# Patient Record
Sex: Female | Born: 1970 | Race: White | Hispanic: No | Marital: Married | Smoking: Never smoker
Health system: Southern US, Community
[De-identification: ages and names within clinical notes are randomized; demographics above are authoritative.]

## PROBLEM LIST (undated history)

## (undated) DIAGNOSIS — T7840XA Allergy, unspecified, initial encounter: Secondary | ICD-10-CM

## (undated) DIAGNOSIS — Z9889 Other specified postprocedural states: Secondary | ICD-10-CM

## (undated) HISTORY — PX: LEEP: SHX91

## (undated) HISTORY — PX: OTHER SURGICAL HISTORY: SHX169

## (undated) HISTORY — PX: KNEE SURGERY: SHX244

## (undated) HISTORY — DX: Allergy, unspecified, initial encounter: T78.40XA

## (undated) HISTORY — PX: MENISCUS REPAIR: SHX5179

## (undated) HISTORY — PX: EXTERNAL EAR SURGERY: SHX627

---

## 2003-05-25 ENCOUNTER — Other Ambulatory Visit: Admission: RE | Admit: 2003-05-25 | Discharge: 2003-05-25 | Payer: Self-pay | Admitting: Obstetrics and Gynecology

## 2004-07-25 ENCOUNTER — Inpatient Hospital Stay: Payer: Self-pay

## 2004-09-13 ENCOUNTER — Ambulatory Visit: Payer: Self-pay | Admitting: Family Medicine

## 2004-11-29 ENCOUNTER — Ambulatory Visit (HOSPITAL_BASED_OUTPATIENT_CLINIC_OR_DEPARTMENT_OTHER): Admission: RE | Admit: 2004-11-29 | Discharge: 2004-11-29 | Payer: Self-pay | Admitting: Orthopedic Surgery

## 2004-12-09 ENCOUNTER — Encounter: Payer: Self-pay | Admitting: Family Medicine

## 2005-02-02 ENCOUNTER — Ambulatory Visit: Payer: Self-pay | Admitting: Internal Medicine

## 2005-07-30 ENCOUNTER — Ambulatory Visit: Payer: Self-pay | Admitting: Family Medicine

## 2005-08-06 ENCOUNTER — Ambulatory Visit: Payer: Self-pay | Admitting: Family Medicine

## 2007-05-16 ENCOUNTER — Ambulatory Visit: Payer: Self-pay | Admitting: Family Medicine

## 2007-05-17 LAB — CONVERTED CEMR LAB
AST: 17 units/L (ref 0–37)
Albumin: 4.2 g/dL (ref 3.5–5.2)
CO2: 28 meq/L (ref 19–32)
Chloride: 104 meq/L (ref 96–112)
Cholesterol: 175 mg/dL (ref 0–200)
Creatinine, Ser: 0.8 mg/dL (ref 0.4–1.2)
HDL: 50.9 mg/dL (ref >39.0)
Sodium: 137 meq/L (ref 135–145)
TSH: 1.77 microintl units/mL (ref 0.35–5.50)
Total Bilirubin: 2.3 mg/dL — ABNORMAL HIGH (ref 0.3–1.2)
Total Protein: 6.8 g/dL (ref 6.0–8.3)
Triglycerides: 76 mg/dL (ref 0–149)
VLDL: 15 mg/dL (ref 0–40)

## 2007-05-20 ENCOUNTER — Encounter: Payer: Self-pay | Admitting: Family Medicine

## 2007-05-20 DIAGNOSIS — E7800 Pure hypercholesterolemia, unspecified: Secondary | ICD-10-CM | POA: Insufficient documentation

## 2007-05-20 DIAGNOSIS — J309 Allergic rhinitis, unspecified: Secondary | ICD-10-CM | POA: Insufficient documentation

## 2007-05-21 ENCOUNTER — Ambulatory Visit: Payer: Self-pay | Admitting: Family Medicine

## 2007-05-21 DIAGNOSIS — E039 Hypothyroidism, unspecified: Secondary | ICD-10-CM | POA: Insufficient documentation

## 2007-05-21 DIAGNOSIS — E038 Other specified hypothyroidism: Secondary | ICD-10-CM | POA: Insufficient documentation

## 2007-07-04 ENCOUNTER — Ambulatory Visit: Payer: Self-pay

## 2007-08-22 ENCOUNTER — Telehealth: Payer: Self-pay | Admitting: Family Medicine

## 2007-11-21 ENCOUNTER — Ambulatory Visit: Payer: Self-pay | Admitting: Family Medicine

## 2007-11-23 LAB — CONVERTED CEMR LAB
Cholesterol: 168 mg/dL (ref 0–200)
GFR calc Af Amer: 122 mL/min
GFR calc non Af Amer: 101 mL/min
Glucose, Bld: 91 mg/dL (ref 70–99)
HDL: 62.4 mg/dL (ref >39.0)
LDL Cholesterol: 99 mg/dL (ref 0–99)
Sodium: 140 meq/L (ref 135–145)
Total CHOL/HDL Ratio: 2.7
VLDL: 7 mg/dL (ref 0–40)

## 2007-11-28 ENCOUNTER — Ambulatory Visit: Payer: Self-pay | Admitting: Family Medicine

## 2008-06-02 ENCOUNTER — Ambulatory Visit: Payer: Self-pay | Admitting: Family Medicine

## 2008-06-03 LAB — CONVERTED CEMR LAB
AST: 17 units/L (ref 0–37)
Alkaline Phosphatase: 55 units/L (ref 39–117)
Bilirubin, Direct: 0.2 mg/dL (ref 0.0–0.3)
Chloride: 112 meq/L (ref 96–112)
Free T4: 0.9 ng/dL (ref 0.6–1.6)
GFR calc non Af Amer: 87 mL/min
LDL Cholesterol: 101 mg/dL — ABNORMAL HIGH (ref 0–99)
Potassium: 4.3 meq/L (ref 3.5–5.1)
Sodium: 142 meq/L (ref 135–145)
Total Bilirubin: 1 mg/dL (ref 0.3–1.2)
Total CHOL/HDL Ratio: 2.6

## 2008-06-07 ENCOUNTER — Ambulatory Visit: Payer: Self-pay | Admitting: Family Medicine

## 2009-03-16 ENCOUNTER — Ambulatory Visit: Payer: Self-pay | Admitting: Family Medicine

## 2009-03-16 DIAGNOSIS — R51 Headache: Secondary | ICD-10-CM | POA: Insufficient documentation

## 2009-03-16 DIAGNOSIS — R519 Headache, unspecified: Secondary | ICD-10-CM | POA: Insufficient documentation

## 2010-03-22 ENCOUNTER — Ambulatory Visit: Payer: Self-pay | Admitting: Family Medicine

## 2010-04-18 ENCOUNTER — Encounter (INDEPENDENT_AMBULATORY_CARE_PROVIDER_SITE_OTHER): Payer: Self-pay | Admitting: *Deleted

## 2010-10-10 NOTE — Letter (Signed)
Summary: Nadara Eaton letter  [place] at [facility]  [address] [place], [place] [address]   Phone: [phone]  Fax: [phone]       04/18/2010 MRN: [id number]  SPRUHA WEIGHT [address] [place], [place]  [address]  Dear Ms. [facility] [place], and [place] announce the retirement of Arta Silence, M.D., from full-time practice at the [facility] office effective March 09, 2010 and his plans of returning part-time.  It is important to Dr. Hetty Ely and to our practice that you understand that [facility] has seven physicians in our office for your health care needs.  We will continue to offer the same exceptional care that you have today.    Dr. Hetty Ely has spoken to many of you about his plans for retirement and returning part-time in the fall.   We will continue to work with you through the transition to schedule appointments for you in the office and meet the high standards that [facility] is committed to.   Again, it is with great pleasure that we share the news that Dr. Hetty Ely will return to [facility] in October of 2011 with a reduced schedule.    If you have any questions, or would like to request an appointment with one of our physicians, please call us at [phone] and press the option for Scheduling an appointment.  We take pleasure in providing you with excellent patient care and look forward to seeing you at your next office visit.  Our [facility] Physicians are:  Tillman Abide, M.D. Laurita Quint, M.D. Roxy Manns, M.D. Kerby Nora, M.D. Hannah Beat, M.D. Ruthe Mannan, M.D. We proudly welcomed Raechel Ache, M.D. and Eustaquio Boyden, M.D. to the practice in July/August 2011.  Sincerely,  [facility]

## 2011-01-26 NOTE — Op Note (Signed)
[name], [name]                ACCOUNT NO.:  [id number]   MEDICAL RECORD NO.:  [id number]          PATIENT TYPE:  AMB   LOCATION:  [facility]                         FACILITY:  [facility]   PHYSICIAN:  Ollen Gross, M.D.    DATE OF BIRTH:  Oct 05, 1970   DATE OF PROCEDURE:  11/29/2004  DATE OF DISCHARGE:                                 OPERATIVE REPORT   PREOPERATIVE DIAGNOSIS:  Right knee medial meniscal tear, bucket handle.   POSTOPERATIVE DIAGNOSIS:  Right knee medial meniscal tear, bucket handle.   PROCEDURE:  Right knee arthroscopy with meniscal debridement.   SURGEON:  Ollen Gross, M.D.   No assistant.   ANESTHESIA:  Local with MAC.   ESTIMATED BLOOD LOSS:  Minimal.   DRAIN:  None.   COMPLICATIONS:  None.   CONDITION:  Stable to recovery.   BRIEF CLINICAL NOTE:  Krista Mosley is a 40 year old female who approximately four to  six weeks ago developed significant pain, locking, and instability in her  right knee.  She has had a previous ACL reconstruction about 10 years ago.  MRI scan showed intact ACL graft but a bucket-handle tear of the medial  meniscus.  She presents now for arthroscopic debridement.   PROCEDURE IN DETAIL:  After successful administration of local with MAC  anesthetic, a tourniquet was placed high on left thigh and the right lower  extremity prepped and draped in the usual sterile fashion.  Standard  superomedial and inferolateral incisions were made.  Inflow cannula passed  superomedial, and camera passed inferolateral.  Arthroscopic visualization  proceeded.  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  Trochlea also looked normal.  Medial and lateral gutters were visualized and were normal.  Flexion valgus  forceps were applied to the knee, and the medial compartment was entered.  She had a large bucket-handle tear starting at the body and coursing all the  way to the posterior attachment of the  medial meniscus.  It was displaced  centrally.  The condyle and tibial plateau looked normal.  Spinal needle was  used to localize the inferomedial portal.  Small incision made.  Elevator  placed.  Then, the arthroscopic scissors were placed.  We cut the fragment  at its base on the body and the base on the posterior horn and removed the  fragment, then debrided the rest of the posterior horn and body back to a  stable remnant.  It was probed and found to be stable.  The cartilage on the  medial femoral condyle and tibial plateau looked normal.  The intercondylar  notch was visualized, and the ACL graft appeared normal and probed normally.  The lateral compartment was entered and was also normal.  The small  cartilage defect on the undersurface of the patella was debrided back to a  stable cartilaginous base with the 4.2 mm shaver.  The arthroscopic  equipment was then removed from the inferior portals  which were closed with interrupted 4-0 nylon.  20 cc of 0.25% Marcaine with  epinephrine were injected through an  inflow cannula.  Then, that cannula was  removed and that portal closed with nylon.  A bulky sterile dressing was  applied, and she was subsequently awakened and transported to recovery in  stable condition.      FA/MEDQ  D:  11/29/2004  T:  11/29/2004  Job:  [id number]

## 2012-06-24 LAB — BASIC METABOLIC PANEL
BUN: 13 mg/dL (ref 4–21)
CREATININE: 0.8 mg/dL (ref 0.5–1.1)
GLUCOSE: 102 mg/dL
POTASSIUM: 4.5 mmol/L (ref 3.4–5.3)
SODIUM: 140 mmol/L (ref 137–147)

## 2012-06-24 LAB — HEPATIC FUNCTION PANEL
ALT: 8 U/L (ref 7–35)
AST: 14 U/L (ref 13–35)
Alkaline Phosphatase: 59 U/L (ref 25–125)
Bilirubin, Total: 1.1 mg/dL

## 2012-06-24 LAB — TSH: TSH: 2.32 u[IU]/mL (ref 0.41–5.90)

## 2012-06-24 LAB — LIPID PANEL
CHOLESTEROL: 197 mg/dL (ref 0–200)
HDL: 94 mg/dL — AB (ref 35–70)
LDL CALC: 88 mg/dL
LDl/HDL Ratio: 0.9
TRIGLYCERIDES: 73 mg/dL (ref 40–160)

## 2014-02-25 LAB — CBC AND DIFFERENTIAL
HCT: 42 % (ref 36–46)
Hemoglobin: 14.2 g/dL (ref 12.0–16.0)
NEUTROS ABS: 2 /uL
Platelets: 263 10*3/uL (ref 150–399)
WBC: 4.1 10^3/mL

## 2014-02-25 LAB — HEMOGLOBIN A1C: HEMOGLOBIN A1C: 5.6

## 2015-07-23 ENCOUNTER — Ambulatory Visit (INDEPENDENT_AMBULATORY_CARE_PROVIDER_SITE_OTHER): Payer: No Typology Code available for payment source

## 2015-07-23 DIAGNOSIS — Z23 Encounter for immunization: Secondary | ICD-10-CM

## 2015-08-17 ENCOUNTER — Encounter: Payer: Self-pay | Admitting: Family Medicine

## 2016-01-04 ENCOUNTER — Telehealth: Payer: Self-pay | Admitting: Family Medicine

## 2016-01-04 NOTE — Telephone Encounter (Signed)
Pt needs for us to look through her records and see if we have a record of her having a td vaccine. Pt is going on a field trip next week and needs to know that date of vaccine.  Her call back is [phone]  Thanks, Barth Kirksteri

## 2016-01-10 NOTE — Telephone Encounter (Signed)
Pt called back to see if she could find out when she had her last Tetanus shot and when she is do for another. Please advise. Thanks TNP

## 2016-01-11 ENCOUNTER — Telehealth: Payer: Self-pay | Admitting: Family Medicine

## 2016-01-11 NOTE — Telephone Encounter (Signed)
Tdap in 06/16/2011. LMTCB to inform pt. Allene DillonEmily Drozdowski, CMA

## 2016-01-11 NOTE — Telephone Encounter (Signed)
Pt returning call, please return her call, pt states it's ok to leave message to what pt is requesting to know. CB# [phone]   Thanks CC

## 2016-01-11 NOTE — Telephone Encounter (Signed)
Left message advising pt.   Thanks,   -Laura  

## 2016-01-25 DIAGNOSIS — B001 Herpesviral vesicular dermatitis: Secondary | ICD-10-CM | POA: Insufficient documentation

## 2016-01-25 DIAGNOSIS — N83209 Unspecified ovarian cyst, unspecified side: Secondary | ICD-10-CM | POA: Insufficient documentation

## 2016-01-25 DIAGNOSIS — Z862 Personal history of diseases of the blood and blood-forming organs and certain disorders involving the immune mechanism: Secondary | ICD-10-CM | POA: Insufficient documentation

## 2016-01-25 DIAGNOSIS — R739 Hyperglycemia, unspecified: Secondary | ICD-10-CM | POA: Insufficient documentation

## 2016-01-30 DIAGNOSIS — H524 Presbyopia: Secondary | ICD-10-CM | POA: Diagnosis not present

## 2016-01-30 DIAGNOSIS — H5203 Hypermetropia, bilateral: Secondary | ICD-10-CM | POA: Diagnosis not present

## 2016-03-07 ENCOUNTER — Encounter: Payer: Self-pay | Admitting: Family Medicine

## 2016-03-07 ENCOUNTER — Ambulatory Visit (INDEPENDENT_AMBULATORY_CARE_PROVIDER_SITE_OTHER): Payer: BLUE CROSS/BLUE SHIELD | Admitting: Family Medicine

## 2016-03-07 VITALS — BP 100/68 | HR 72 | Temp 98.0°F | Resp 16 | Ht 64.0 in | Wt 146.0 lb

## 2016-03-07 DIAGNOSIS — Z0000 Encounter for general adult medical examination without abnormal findings: Secondary | ICD-10-CM

## 2016-03-07 DIAGNOSIS — E7800 Pure hypercholesterolemia, unspecified: Secondary | ICD-10-CM

## 2016-03-07 DIAGNOSIS — R739 Hyperglycemia, unspecified: Secondary | ICD-10-CM

## 2016-03-07 DIAGNOSIS — E038 Other specified hypothyroidism: Secondary | ICD-10-CM

## 2016-03-07 DIAGNOSIS — E039 Hypothyroidism, unspecified: Secondary | ICD-10-CM

## 2016-03-07 DIAGNOSIS — D508 Other iron deficiency anemias: Secondary | ICD-10-CM

## 2016-03-07 LAB — POCT URINALYSIS DIPSTICK
Bilirubin, UA: NEGATIVE
Glucose, UA: NEGATIVE
KETONES UA: NEGATIVE
Leukocytes, UA: NEGATIVE
Nitrite, UA: NEGATIVE
PH UA: 6
PROTEIN UA: NEGATIVE
RBC UA: NEGATIVE
Urobilinogen, UA: 0.2

## 2016-03-07 NOTE — Progress Notes (Signed)
Patient: Krista FinesKyle B Surges, Female    DOB: Jun 06, 1971, 45 y.o.   MRN: [id number] Visit Date: 03/07/2016  Today's Provider: Lorie PhenixNancy Chancy Smigiel, MD   Chief Complaint  Patient presents with  . Annual Exam   Subjective:    Annual physical exam Krista Mosley is a 45 y.o. female who presents today for health maintenance and complete physical. She feels well. She reports exercising daily. She reports she is sleeping well. 07/28/15 pap-neg [facility] 07/28/15 mammogram-BI-RADS 1 -----------------------------------------------------------------   Review of Systems  Constitutional: Negative.   HENT: Negative.   Eyes: Negative.   Respiratory: Negative.   Cardiovascular: Negative.   Gastrointestinal: Negative.   Endocrine: Negative.   Genitourinary: Negative.   Musculoskeletal: Negative.   Skin: Negative.   Allergic/Immunologic: Negative.   Neurological: Negative.   Hematological: Negative.   Psychiatric/Behavioral: Negative.     Social History      She  reports that she has never smoked. She has never used smokeless tobacco. She reports that she drinks about 3.0 oz of alcohol per week. She reports that she does not use illicit drugs.       Social History   Social History  . Marital Status: Married    Spouse Name: N/A  . Number of Children: 2  . Years of Education: N/A   Social History Main Topics  . Smoking status: Never Smoker   . Smokeless tobacco: Never Used  . Alcohol Use: 3.0 oz/week    5 Glasses of wine per week  . Drug Use: No  . Sexual Activity: Not Asked   Other Topics Concern  . None   Social History Narrative    No past medical history on file.   Patient Active Problem List   Diagnosis Date Noted  . 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 Ovarian retention cyst 01/25/2016  . 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 Blood glucose elevated 01/25/2016  . HEADACHE 03/16/2009  . Subclinical hypothyroidism 05/21/2007  .  Hypercholesteremia 05/20/2007  . ALLERGIC RHINITIS 05/20/2007    Past Surgical History  Procedure Laterality Date  . Meniscus repair    . Cesarean section      Family History        Family Status  Relation Status Death Age  . Mother Alive   . Father Alive   . Brother Alive   . Brother Alive   . Daughter Alive         Her family history includes Breast cancer in her mother; Cancer (age of onset: 2061) in her mother.    Allergies  Allergen Reactions  . Codeine     REACTION: NAUSEA + VOMITING    Current Meds  Medication Sig  . Cetirizine HCl (ZYRTEC ALLERGY) 10 MG CAPS Take by mouth.    Patient Care Team: Lorie PhenixNancy Creasie Lacosse, MD as PCP - General (Family Medicine)     Objective:   Vitals: BP 100/68 mmHg  Pulse 72  Temp(Src) 98 F (36.7 C) (Oral)  Resp 16  Ht 5\' 4"  (1.626 m)  Wt 146 lb (66.225 kg)  BMI 25.05 kg/m2   Physical Exam  Constitutional: She is oriented to person, place, and time. She appears well-developed and well-nourished.  HENT:  Head: Normocephalic and atraumatic.  Right Ear: Tympanic membrane, external ear and ear canal normal.  Left Ear: Tympanic membrane, external ear and ear canal normal.  Nose: Nose normal.  Mouth/Throat: Uvula is midline, oropharynx is clear and  moist and mucous membranes are normal.  Eyes: Conjunctivae, EOM and lids are normal. Pupils are equal, round, and reactive to light.  Neck: Trachea normal and normal range of motion. Neck supple. Carotid bruit is not present. No thyroid mass and no thyromegaly present.  Cardiovascular: Normal rate, regular rhythm and normal heart sounds.   Pulmonary/Chest: Effort normal and breath sounds normal.  Abdominal: Soft. Normal appearance and bowel sounds are normal. There is no hepatosplenomegaly. There is no tenderness.  Musculoskeletal: Normal range of motion.  Lymphadenopathy:    She has no cervical adenopathy.    She has no axillary adenopathy.  Neurological: She is alert and oriented to  person, place, and time. She has normal strength. No cranial nerve deficit.  Skin: Skin is warm, dry and intact.  Psychiatric: She has a normal mood and affect. Her speech is normal and behavior is normal. Judgment and thought content normal. Cognition and memory are normal.    Depression Screen PHQ 2/9 Scores 03/07/2016  PHQ - 2 Score 0     Assessment & Plan:     Routine Health Maintenance and Physical Exam  Exercise Activities and Dietary recommendations Goals    None      Immunization History  Administered Date(s) Administered  . Influenza Whole 08/06/2005  . Influenza,inj,Quad PF,36+ Mos 07/23/2015  . Td 08/06/2005, 06/16/2011, 01/10/2016  . Tdap 06/16/2011, 01/10/2016   --------------------------------------------------------------------  1. Annual physical exam Stable. Patient advised to continue eating healthy and exercise daily. - POCT urinalysis dipstick  2. Subclinical hypothyroidism F/U pending lab report. - TSH  3. Hypercholesteremia F/U pending lab report. - Comprehensive metabolic panel - Lipid Panel With LDL/HDL Ratio  4. Other iron deficiency anemias F/U pending lab report. - CBC with Differential/Platelet  5. Blood glucose elevated F/U pending lab report. - Hemoglobin A1c   Patient seen and examined by Dr. Leo GrosserNancy J.. Esau Fridman, and note scribed by Liz BeachSulibeya S. Dimas, CMA.  I have reviewed the document for accuracy and completeness and I agree with above. - Leo GrosserNancy J. Rafaella Kole, MD   Lorie PhenixNancy Jawann Urbani, MD  [facility]

## 2016-03-08 LAB — CBC WITH DIFFERENTIAL/PLATELET
BASOS: 1 %
Basophils Absolute: 0 10*3/uL (ref 0.0–0.2)
EOS (ABSOLUTE): 0.1 10*3/uL (ref 0.0–0.4)
EOS: 2 %
HEMATOCRIT: 41 % (ref 34.0–46.6)
HEMOGLOBIN: 13.8 g/dL (ref 11.1–15.9)
IMMATURE GRANS (ABS): 0 10*3/uL (ref 0.0–0.1)
IMMATURE GRANULOCYTES: 0 %
Lymphocytes Absolute: 1.6 10*3/uL (ref 0.7–3.1)
Lymphs: 38 %
MCH: 30.9 pg (ref 26.6–33.0)
MCHC: 33.7 g/dL (ref 31.5–35.7)
MCV: 92 fL (ref 79–97)
MONOS ABS: 0.4 10*3/uL (ref 0.1–0.9)
Monocytes: 9 %
NEUTROS PCT: 50 %
Neutrophils Absolute: 2.1 10*3/uL (ref 1.4–7.0)
Platelets: 302 10*3/uL (ref 150–379)
RBC: 4.47 x10E6/uL (ref 3.77–5.28)
RDW: 14.2 % (ref 12.3–15.4)
WBC: 4.1 10*3/uL (ref 3.4–10.8)

## 2016-03-08 LAB — COMPREHENSIVE METABOLIC PANEL
A/G RATIO: 2 (ref 1.2–2.2)
ALBUMIN: 4.7 g/dL (ref 3.5–5.5)
ALT: 14 IU/L (ref 0–32)
AST: 12 IU/L (ref 0–40)
Alkaline Phosphatase: 61 IU/L (ref 39–117)
BUN / CREAT RATIO: 17 (ref 9–23)
BUN: 14 mg/dL (ref 6–24)
Bilirubin Total: 1.3 mg/dL — ABNORMAL HIGH (ref 0.0–1.2)
CALCIUM: 9.6 mg/dL (ref 8.7–10.2)
CO2: 26 mmol/L (ref 18–29)
Chloride: 101 mmol/L (ref 96–106)
Creatinine, Ser: 0.81 mg/dL (ref 0.57–1.00)
GFR calc Af Amer: 103 mL/min/{1.73_m2} (ref >59)
GFR, EST NON AFRICAN AMERICAN: 89 mL/min/{1.73_m2} (ref >59)
GLOBULIN, TOTAL: 2.4 g/dL (ref 1.5–4.5)
Glucose: 88 mg/dL (ref 65–99)
POTASSIUM: 4.7 mmol/L (ref 3.5–5.2)
SODIUM: 143 mmol/L (ref 134–144)
TOTAL PROTEIN: 7.1 g/dL (ref 6.0–8.5)

## 2016-03-08 LAB — HEMOGLOBIN A1C
Est. average glucose Bld gHb Est-mCnc: 100 mg/dL
Hgb A1c MFr Bld: 5.1 % (ref 4.8–5.6)

## 2016-03-08 LAB — LIPID PANEL WITH LDL/HDL RATIO
Cholesterol, Total: 203 mg/dL — ABNORMAL HIGH (ref 100–199)
HDL: 83 mg/dL (ref >39)
LDL Calculated: 98 mg/dL (ref 0–99)
LDl/HDL Ratio: 1.2 ratio units (ref 0.0–3.2)
Triglycerides: 110 mg/dL (ref 0–149)
VLDL CHOLESTEROL CAL: 22 mg/dL (ref 5–40)

## 2016-03-08 LAB — TSH: TSH: 2.36 u[IU]/mL (ref 0.450–4.500)

## 2016-03-09 DIAGNOSIS — D2262 Melanocytic nevi of left upper limb, including shoulder: Secondary | ICD-10-CM | POA: Diagnosis not present

## 2016-12-03 ENCOUNTER — Telehealth: Payer: Self-pay | Admitting: Physician Assistant

## 2016-12-03 MED ORDER — OSELTAMIVIR PHOSPHATE 75 MG PO CAPS: 75.0000 mg | ORAL_CAPSULE | Freq: Every day | ORAL | 0 refills | Status: DC

## 2016-12-03 NOTE — Telephone Encounter (Signed)
Per verbal order sent in tamiflu. Pt advised.

## 2016-12-03 NOTE — Telephone Encounter (Signed)
Please advise. Thanks.  

## 2016-12-03 NOTE — Telephone Encounter (Signed)
Please review-aa 

## 2016-12-03 NOTE — Telephone Encounter (Signed)
Pt stated that one of her daughter's was Dx with Type A Flu yesterday and the urgent care advised her that if might be helpful if she and her other daughter took Tamaflu as well. Pt is requesting a call back to see what Antony ContrasJenni suggest. [facility] [address] Please advise. Thanks TNP

## 2016-12-03 NOTE — Addendum Note (Signed)
Addended by: Latanya Presser'DELL, BRITTANY M on: 12/03/2016 05:08 PM   Modules accepted: Orders

## 2017-01-02 DIAGNOSIS — L448 Other specified papulosquamous disorders: Secondary | ICD-10-CM | POA: Diagnosis not present

## 2017-03-01 ENCOUNTER — Encounter: Payer: Self-pay | Admitting: Physician Assistant

## 2017-03-01 ENCOUNTER — Ambulatory Visit (INDEPENDENT_AMBULATORY_CARE_PROVIDER_SITE_OTHER): Payer: BLUE CROSS/BLUE SHIELD | Admitting: Physician Assistant

## 2017-03-01 VITALS — BP 108/76 | HR 91 | Temp 98.0°F | Resp 16 | Wt 150.0 lb

## 2017-03-01 DIAGNOSIS — J0110 Acute frontal sinusitis, unspecified: Secondary | ICD-10-CM

## 2017-03-01 MED ORDER — AMOXICILLIN-POT CLAVULANATE 875-125 MG PO TABS: 1.0000 | ORAL_TABLET | Freq: Two times a day (BID) | ORAL | 0 refills | Status: AC

## 2017-03-01 NOTE — Patient Instructions (Signed)

## 2017-03-01 NOTE — Progress Notes (Signed)
Patient: Krista Mosley Female    DOB: 02/13/1971   46 y.o.   MRN: [id number] Visit Date: 03/01/2017  Today's Provider: Trey Sailors, PA-C   Chief Complaint  Patient presents with  . URI   Subjective:    URI   This is a new problem. Episode onset: x 1 week. The problem has been gradually worsening. There has been no fever. Associated symptoms include congestion, coughing (dry), headaches, a plugged ear sensation, rhinorrhea, sinus pain, sneezing, a sore throat and swollen glands. Pertinent negatives include no abdominal pain, chest pain, diarrhea, dysuria, ear pain, nausea, neck pain, vomiting or wheezing. Treatments tried: Sudafed, Flonase, Benadryl, Zyrtec, DayQuil, NyQuil, APAP. The treatment provided no relief.       Allergies  Allergen Reactions  . Codeine     REACTION: NAUSEA + VOMITING     Current Outpatient Prescriptions:  .  Cetirizine HCl (ZYRTEC ALLERGY) 10 MG CAPS, Take by mouth., Disp: , Rfl:  .  fluticasone (FLONASE) 50 MCG/ACT nasal spray, Place into both nostrils daily., Disp: , Rfl:   Review of Systems  HENT: Positive for congestion, rhinorrhea, sinus pain, sneezing and sore throat. Negative for ear pain.   Respiratory: Positive for cough (dry). Negative for wheezing.   Cardiovascular: Negative for chest pain.  Gastrointestinal: Negative for abdominal pain, diarrhea, nausea and vomiting.  Genitourinary: Negative for dysuria.  Musculoskeletal: Negative for neck pain.  Neurological: Positive for headaches.    Social History  Substance Use Topics  . Smoking status: Never Smoker  . Smokeless tobacco: Never Used  . Alcohol use 3.0 oz/week    5 Glasses of wine per week   Objective:   BP 108/76 (BP Location: Left Arm, Patient Position: Sitting, Cuff Size: Normal)   Pulse 91   Temp 98 F (36.7 C) (Oral)   Resp 16   Wt 150 lb (68 kg)   LMP 02/22/2017   SpO2 98%   BMI 25.75 kg/m  Vitals:   03/01/17 1423  BP: 108/76  Pulse: 91  Resp: 16    Temp: 98 F (36.7 C)  TempSrc: Oral  SpO2: 98%  Weight: 150 lb (68 kg)     Physical Exam  Constitutional: She is oriented to person, place, and time. She appears well-developed and well-nourished. No distress.  HENT:  Right Ear: External ear normal.  Left Ear: External ear normal.  Nose: Right sinus exhibits no maxillary sinus tenderness and no frontal sinus tenderness. Left sinus exhibits no maxillary sinus tenderness and no frontal sinus tenderness.  Mouth/Throat: Oropharynx is clear and moist. No oropharyngeal exudate, posterior oropharyngeal edema or posterior oropharyngeal erythema.  Tms opaque bilaterally   Eyes: Conjunctivae are normal. Right eye exhibits no discharge. Left eye exhibits no discharge.  Neck: Neck supple.  Cardiovascular: Normal rate and regular rhythm.   Pulmonary/Chest: Effort normal and breath sounds normal.  Lymphadenopathy:    She has no cervical adenopathy.  Neurological: She is alert and oriented to person, place, and time.  Skin: Skin is warm and dry. She is not diaphoretic.  Psychiatric: She has a normal mood and affect. Her behavior is normal.        Assessment & Plan:     1. Acute non-recurrent frontal sinusitis  Gave hard script to fill if worsening after 10 days, or severe facial pain/pressure, green rhinorrhea. Can use Sudafed on plane ride, she is going to [place].  - amoxicillin-clavulanate (AUGMENTIN) 875-125 MG tablet; Take 1 tablet by  mouth 2 (two) times daily.  Dispense: 20 tablet; Refill: 0  Return if symptoms worsen or fail to improve.  The entirety of the information documented in the History of Present Illness, Review of Systems and Physical Exam were personally obtained by me. Portions of this information were initially documented by Selinda EonEmily D, CMA and reviewed by me for thoroughness and accuracy.         Trey SailorsAdriana M Pollak, PA-C  [facility]

## 2017-04-04 ENCOUNTER — Ambulatory Visit: Payer: Self-pay | Admitting: Obstetrics & Gynecology

## 2017-04-05 DIAGNOSIS — D225 Melanocytic nevi of trunk: Secondary | ICD-10-CM | POA: Diagnosis not present

## 2017-05-01 LAB — LIPID PANEL
Cholesterol: 200 (ref 0–200)
HDL: 73 — AB (ref 35–70)
LDL CALC: 111
TRIGLYCERIDES: 76 (ref 40–160)

## 2017-05-01 LAB — BASIC METABOLIC PANEL: GLUCOSE: 79

## 2017-05-07 ENCOUNTER — Ambulatory Visit (INDEPENDENT_AMBULATORY_CARE_PROVIDER_SITE_OTHER): Payer: BLUE CROSS/BLUE SHIELD | Admitting: Obstetrics & Gynecology

## 2017-05-07 ENCOUNTER — Encounter: Payer: Self-pay | Admitting: Obstetrics & Gynecology

## 2017-05-07 VITALS — BP 110/70 | HR 81 | Ht 64.0 in | Wt 155.0 lb

## 2017-05-07 DIAGNOSIS — Z124 Encounter for screening for malignant neoplasm of cervix: Secondary | ICD-10-CM

## 2017-05-07 DIAGNOSIS — Z0000 Encounter for general adult medical examination without abnormal findings: Secondary | ICD-10-CM

## 2017-05-07 DIAGNOSIS — Z01419 Encounter for gynecological examination (general) (routine) without abnormal findings: Secondary | ICD-10-CM

## 2017-05-07 DIAGNOSIS — Z1231 Encounter for screening mammogram for malignant neoplasm of breast: Secondary | ICD-10-CM

## 2017-05-07 DIAGNOSIS — Z1239 Encounter for other screening for malignant neoplasm of breast: Secondary | ICD-10-CM

## 2017-05-07 NOTE — Progress Notes (Signed)
HPI:      [name] is a 46 y.o. G2P2 who LMP was Patient's last menstrual period was 04/26/2017.,she presents today for her annual examination. The patient has no complaints today. The patient is sexually active. Her last pap: approximate date 2016 and was normal and last mammogram: approximate date 2016 and was normal. The patient does perform self breast exams.  There is notable family history of breast or ovarian cancer in her family.  The patient has regular exercise: yes.  The patient denies current symptoms of depression.   Periods reg, no concerns  GYN History: Contraception: vasectomy  PMHx: History reviewed. No pertinent past medical history. Past Surgical History:  Procedure Laterality Date  . CESAREAN SECTION    . cryotherapy    . EXTERNAL EAR SURGERY    . KNEE SURGERY    . LEEP    . MENISCUS REPAIR     Family History  Problem Relation Age of Onset  . Breast cancer Mother   . Cancer Mother 41       breast   Social History  Substance Use Topics  . Smoking status: Never Smoker  . Smokeless tobacco: Never Used  . Alcohol use 3.0 oz/week    5 Glasses of wine per week    Current Outpatient Prescriptions:  .  Cetirizine HCl (ZYRTEC ALLERGY) 10 MG CAPS, Take by mouth., Disp: , Rfl:  .  fluticasone (FLONASE) 50 MCG/ACT nasal spray, Place into both nostrils daily., Disp: , Rfl:  Allergies: Codeine  Review of Systems  Constitutional: Negative for chills, fever and malaise/fatigue.  HENT: Negative for congestion, sinus pain and sore throat.   Eyes: Negative for blurred vision and pain.  Respiratory: Negative for cough and wheezing.   Cardiovascular: Negative for chest pain and leg swelling.  Gastrointestinal: Negative for abdominal pain, constipation, diarrhea, heartburn, nausea and vomiting.  Genitourinary: Negative for dysuria, frequency, hematuria and urgency.  Musculoskeletal: Negative for back pain, joint pain, myalgias and neck pain.  Skin: Negative  for itching and rash.  Neurological: Negative for dizziness, tremors and weakness.  Endo/Heme/Allergies: Does not bruise/bleed easily.  Psychiatric/Behavioral: Negative for depression. The patient is not nervous/anxious and does not have insomnia.     Objective: BP 110/70   Pulse 81   Ht 5\' 4"  (1.626 m)   Wt 155 lb (70.3 kg)   LMP 04/26/2017   BMI 26.61 kg/m   Filed Weights   05/07/17 0803  Weight: 155 lb (70.3 kg)   Body mass index is 26.61 kg/m. Physical Exam  Constitutional: She is oriented to person, place, and time. She appears well-developed and well-nourished. No distress.  Genitourinary: Rectum normal, vagina normal and uterus normal. Pelvic exam was performed with patient supine. There is no rash or lesion on the right labia. There is no rash or lesion on the left labia. Vagina exhibits no lesion. No bleeding in the vagina. Right adnexum does not display mass and does not display tenderness. Left adnexum does not display mass and does not display tenderness. Cervix does not exhibit motion tenderness, lesion, friability or polyp.   Uterus is mobile and midaxial. Uterus is not enlarged or exhibiting a mass.  HENT:  Head: Normocephalic and atraumatic. Head is without laceration.  Right Ear: Hearing normal.  Left Ear: Hearing normal.  Nose: No epistaxis.  No foreign bodies.  Mouth/Throat: Uvula is midline, oropharynx is clear and moist and mucous membranes are normal.  Eyes: Pupils are equal, round, and reactive  to light.  Neck: Normal range of motion. Neck supple. No thyromegaly present.  Cardiovascular: Normal rate and regular rhythm.  Exam reveals no gallop and no friction rub.   No murmur heard. Pulmonary/Chest: Effort normal and breath sounds normal. No respiratory distress. She has no wheezes. Right breast exhibits no mass, no skin change and no tenderness. Left breast exhibits no mass, no skin change and no tenderness.  Abdominal: Soft. Bowel sounds are normal. She  exhibits no distension. There is no tenderness. There is no rebound.  Musculoskeletal: Normal range of motion.  Neurological: She is alert and oriented to person, place, and time. No cranial nerve deficit.  Skin: Skin is warm and dry.  Psychiatric: She has a normal mood and affect. Judgment normal.  Vitals reviewed.   Assessment:  ANNUAL EXAM 1. Annual physical exam   2. Screening for cervical cancer   3. Screening for breast cancer      Screening Plan:            1.  Cervical Screening-  Pap smear done today  2. Breast screening- Exam annually and mammogram>40 planned   3. Colonoscopy every 10 years, Hemoccult testing - after age 62  4. Labs managed by PCP  5. Counseling for contraception: vasectomy  Other:  1. Annual physical exam  2. Screening for cervical cancer - IGP, Aptima HPV  3. Screening for breast cancer - MM DIGITAL SCREENING BILATERAL; Future    F/U  Return in about 1 year (around 05/07/2018) for Annual.  Annamarie Major, MD, [facility], [facility] 05/07/2017  8:23 AM

## 2017-05-07 NOTE — Patient Instructions (Signed)
PAP every three years Mammogram every year    Call [phone] to schedule at [facility] Colonoscopy every 10 years after age 45 Labs yearly or so (with PCP)

## 2017-05-10 LAB — IGP, APTIMA HPV
HPV Aptima: NEGATIVE
PAP Smear Comment: 0

## 2017-05-17 ENCOUNTER — Encounter: Payer: Self-pay | Admitting: Family Medicine

## 2017-05-17 ENCOUNTER — Ambulatory Visit (INDEPENDENT_AMBULATORY_CARE_PROVIDER_SITE_OTHER): Payer: BLUE CROSS/BLUE SHIELD | Admitting: Family Medicine

## 2017-05-17 VITALS — BP 92/62 | HR 76 | Temp 98.1°F | Resp 16 | Wt 154.0 lb

## 2017-05-17 DIAGNOSIS — Z862 Personal history of diseases of the blood and blood-forming organs and certain disorders involving the immune mechanism: Secondary | ICD-10-CM

## 2017-05-17 DIAGNOSIS — E7800 Pure hypercholesterolemia, unspecified: Secondary | ICD-10-CM

## 2017-05-17 DIAGNOSIS — R739 Hyperglycemia, unspecified: Secondary | ICD-10-CM

## 2017-05-17 DIAGNOSIS — E039 Hypothyroidism, unspecified: Secondary | ICD-10-CM

## 2017-05-17 DIAGNOSIS — E038 Other specified hypothyroidism: Secondary | ICD-10-CM

## 2017-05-17 DIAGNOSIS — Z114 Encounter for screening for human immunodeficiency virus [HIV]: Secondary | ICD-10-CM

## 2017-05-17 NOTE — Assessment & Plan Note (Signed)
Asymptomatic currently Recheck CBC

## 2017-05-17 NOTE — Progress Notes (Signed)
Patient: Krista Mosley Female    DOB: 1971/03/19   46 y.o.   MRN: [id number] Visit Date: 05/17/2017  Today's Provider: Shirlee LatchAngela Jonica Bickhart, MD   Chief Complaint  Patient presents with  . Establish Care   Subjective:    HPI   Krista Mosley presents to establish care. She was previously seeing Dr. Elease HashimotoMaloney for her care. She feels well without complaints. She had [facility]07/2017. She recently had her labs checked through work, and would like to discuss the results. A lipid panel and glucose were checked. Glucose was 79 mg/dl. Total cholesterol was 200, triglycerides were 76, HDL was 73 and LDL was 111.  Subclinical hypothyroidism: previously taking low dose synthroid, has not taken any in several years.  Thinks this temporary suppression was due to a diet pill.  H/o anemia: thinks this was related to giving blood frequently.  Takes daily iron supplement.  Allergies  Allergen Reactions  . Codeine     REACTION: NAUSEA + VOMITING     Current Outpatient Prescriptions:  .  Cetirizine HCl (ZYRTEC ALLERGY) 10 MG CAPS, Take by mouth., Disp: , Rfl:  .  fluticasone (FLONASE) 50 MCG/ACT nasal spray, Place 2 sprays into both nostrils daily as needed. , Disp: , Rfl:   Review of Systems  Constitutional: Negative for activity change, appetite change, chills, diaphoresis, fatigue, fever and unexpected weight change.  Respiratory: Negative for cough, chest tightness and shortness of breath.   Cardiovascular: Negative for chest pain and leg swelling.  Endocrine: Negative.     Social History  Substance Use Topics  . Smoking status: Never Smoker  . Smokeless tobacco: Never Used  . Alcohol use 3.0 oz/week    5 Glasses of wine per week   Objective:   BP 92/62 (BP Location: Left Arm, Patient Position: Sitting, Cuff Size: Normal)   Pulse 76   Temp 98.1 F (36.7 C) (Oral)   Resp 16   Wt 154 lb (69.9 kg)   LMP 04/26/2017   BMI 26.43 kg/m  Vitals:   05/17/17 0807  BP: 92/62  Pulse: 76  Resp: 16  Temp: 98.1 F (36.7 C)  TempSrc: Oral  Weight: 154 lb (69.9 kg)     Physical Exam  Constitutional: She is oriented to person, place, and time. She appears well-developed and well-nourished. No distress.  HENT:  Head: Normocephalic and atraumatic.  Eyes: Conjunctivae are normal. No scleral icterus.  Cardiovascular: Normal rate, regular rhythm, normal heart sounds and intact distal pulses.   No murmur heard. Pulmonary/Chest: Effort normal and breath sounds normal. No respiratory distress. She has no wheezes. She has no rales.  Musculoskeletal: She exhibits no edema.  Neurological: She is alert and oriented to person, place, and time.  Skin: Skin is warm and dry. No rash noted.  Psychiatric: She has a normal mood and affect. Her behavior is normal.       Assessment & Plan:      Problem List Items Addressed This Visit      Endocrine   Subclinical hypothyroidism - Primary    Recheck TSH      Relevant Orders   TSH     Other   Hypercholesteremia    Not on statin Cholesterol is fairly balanced with high HDL and LDL <130 Continue regular exercise Check CMP Recheck in 1 yr      Relevant Orders   Comprehensive metabolic panel   History of anemia  Asymptomatic currently Recheck CBC      Relevant Orders   CBC w/Diff/Platelet   Blood glucose elevated    Fasting BG on labwork from job is 79 Continue diet and exercise      Relevant Orders   Comprehensive metabolic panel    Other Visit Diagnoses    Screening for HIV (human immunodeficiency virus)       Relevant Orders   HIV antibody (with reflex)         Return in about 1 year (around 05/17/2018) for CPE.  The entirety of the information documented in the History of Present Illness, Review of Systems and Physical Exam were personally obtained by me. Portions of this information were initially documented by Irving Burton Ratchford, CMA and reviewed by me for thoroughness  and accuracy.     Shirlee Latch, MD  [facility]

## 2017-05-17 NOTE — Assessment & Plan Note (Signed)
Fasting BG on labwork from job is 79 Continue diet and exercise

## 2017-05-17 NOTE — Assessment & Plan Note (Signed)
Recheck TSH 

## 2017-05-17 NOTE — Assessment & Plan Note (Addendum)
Not on statin Cholesterol is fairly balanced with high HDL and LDL <130 Continue regular exercise Check CMP Recheck in 1 yr

## 2017-05-17 NOTE — Patient Instructions (Signed)

## 2017-05-18 LAB — CBC WITH DIFFERENTIAL/PLATELET
BASOS PCT: 0.7 %
Basophils Absolute: 29 cells/uL (ref 0–200)
EOS PCT: 1.7 %
Eosinophils Absolute: 71 cells/uL (ref 15–500)
HCT: 41.8 % (ref 35.0–45.0)
Hemoglobin: 14.1 g/dL (ref 11.7–15.5)
LYMPHS ABS: 1432 {cells}/uL (ref 850–3900)
MCH: 30.9 pg (ref 27.0–33.0)
MCHC: 33.7 g/dL (ref 32.0–36.0)
MCV: 91.5 fL (ref 80.0–100.0)
MPV: 8.9 fL (ref 7.5–12.5)
Monocytes Relative: 10.4 %
Neutro Abs: 2230 cells/uL (ref 1500–7800)
Neutrophils Relative %: 53.1 %
PLATELETS: 276 10*3/uL (ref 140–400)
RBC: 4.57 10*6/uL (ref 3.80–5.10)
RDW: 12.9 % (ref 11.0–15.0)
Total Lymphocyte: 34.1 %
WBC mixed population: 437 cells/uL (ref 200–950)
WBC: 4.2 10*3/uL (ref 3.8–10.8)

## 2017-05-18 LAB — COMPREHENSIVE METABOLIC PANEL
AG Ratio: 2 (calc) (ref 1.0–2.5)
ALBUMIN MSPROF: 4.3 g/dL (ref 3.6–5.1)
ALKALINE PHOSPHATASE (APISO): 44 U/L (ref 33–115)
ALT: 10 U/L (ref 6–29)
AST: 10 U/L (ref 10–30)
BILIRUBIN TOTAL: 1 mg/dL (ref 0.2–1.2)
BUN: 16 mg/dL (ref 7–25)
CALCIUM: 9.5 mg/dL (ref 8.6–10.2)
CHLORIDE: 106 mmol/L (ref 98–110)
CO2: 25 mmol/L (ref 20–32)
CREATININE: 0.7 mg/dL (ref 0.50–1.10)
Globulin: 2.2 g/dL (calc) (ref 1.9–3.7)
Glucose, Bld: 76 mg/dL (ref 65–139)
POTASSIUM: 4.5 mmol/L (ref 3.5–5.3)
Sodium: 138 mmol/L (ref 135–146)
Total Protein: 6.5 g/dL (ref 6.1–8.1)

## 2017-05-18 LAB — TSH: TSH: 1.6 mIU/L

## 2017-05-18 LAB — SPECIMEN ID NOTIFICATION MISSING 2ND ID

## 2017-05-18 LAB — HIV ANTIBODY (ROUTINE TESTING W REFLEX): HIV 1&2 Ab, 4th Generation: NONREACTIVE

## 2017-05-20 ENCOUNTER — Telehealth: Payer: Self-pay

## 2017-05-20 NOTE — Telephone Encounter (Signed)
-----   Message from Erasmo DownerAngela M Bacigalupo, MD sent at 05/18/2017  9:39 AM EDT ----- Normal labs - Thyroid function, Blood counts, kidney function, liver function, electrolytes.  Negative HIV screening  Bacigalupo, Marzella SchleinAngela M, MD, MPH [facility] 05/18/2017 9:39 AM

## 2017-05-20 NOTE — Telephone Encounter (Signed)
Pt advised.

## 2017-05-22 ENCOUNTER — Ambulatory Visit
Admission: RE | Admit: 2017-05-22 | Discharge: 2017-05-22 | Disposition: A | Discharge status: Home / Self Care | Payer: BLUE CROSS/BLUE SHIELD | Source: Ambulatory Visit | Attending: Obstetrics & Gynecology | Admitting: Obstetrics & Gynecology

## 2017-05-22 ENCOUNTER — Encounter: Payer: Self-pay | Admitting: Physician Assistant

## 2017-05-22 DIAGNOSIS — Z1239 Encounter for other screening for malignant neoplasm of breast: Secondary | ICD-10-CM

## 2017-05-22 DIAGNOSIS — Z1231 Encounter for screening mammogram for malignant neoplasm of breast: Secondary | ICD-10-CM | POA: Insufficient documentation

## 2017-05-28 ENCOUNTER — Other Ambulatory Visit: Payer: Self-pay | Admitting: *Deleted

## 2017-05-28 ENCOUNTER — Inpatient Hospital Stay
Admission: RE | Admit: 2017-05-28 | Discharge: 2017-05-28 | Disposition: A | Discharge status: Home / Self Care | Payer: Self-pay | Source: Ambulatory Visit | Attending: *Deleted | Admitting: *Deleted

## 2017-05-28 DIAGNOSIS — Z9289 Personal history of other medical treatment: Secondary | ICD-10-CM

## 2017-07-16 ENCOUNTER — Ambulatory Visit: Payer: BLUE CROSS/BLUE SHIELD | Admitting: Family Medicine

## 2017-07-16 ENCOUNTER — Encounter: Payer: Self-pay | Admitting: Family Medicine

## 2017-07-16 VITALS — BP 108/64 | HR 76 | Temp 97.8°F | Resp 16

## 2017-07-16 DIAGNOSIS — L237 Allergic contact dermatitis due to plants, except food: Secondary | ICD-10-CM | POA: Diagnosis not present

## 2017-07-16 DIAGNOSIS — L0100 Impetigo, unspecified: Secondary | ICD-10-CM | POA: Insufficient documentation

## 2017-07-16 MED ORDER — MUPIROCIN 2 % EX OINT: 1.0000 "application " | TOPICAL_OINTMENT | Freq: Two times a day (BID) | CUTANEOUS | 0 refills | Status: DC

## 2017-07-16 MED ORDER — PREDNISONE 20 MG PO TABS: ORAL_TABLET | ORAL | 0 refills | Status: DC

## 2017-07-16 NOTE — Progress Notes (Signed)
Patient: Krista FinesKyle B Bauers Female    DOB: 23-Feb-1971   46 y.o.   MRN: [id number] Visit Date: 07/16/2017  Today's Provider: Shirlee LatchAngela Angelo Prindle, MD   Chief Complaint  Patient presents with  . Rash   Subjective:    Rash  This is a new problem. Episode onset: 07/07/2017. The problem is unchanged. The affected locations include the left arm, right arm, left ankle and right ankle. The rash is characterized by redness, itchiness, draining and blistering. She was exposed to plant contact. Pertinent negatives include no anorexia, congestion, cough, facial edema, fatigue, fever, joint pain, nail changes, rhinorrhea, shortness of breath, sore throat or vomiting. Past treatments include anti-itch cream. The treatment provided mild relief.   This occurred after cleaning up brush around the neighborhood recently.  Husband with similar rash and being treated with prednisone.  Wound on right ankle has recentlystarted having more drainage and crusting over it. She is using calamine lotion that helps some, but the rash continues to get worse.    Allergies  Allergen Reactions  . Codeine     REACTION: NAUSEA + VOMITING     Current Outpatient Medications:  .  Cetirizine HCl (ZYRTEC ALLERGY) 10 MG CAPS, Take by mouth., Disp: , Rfl:  .  fluticasone (FLONASE) 50 MCG/ACT nasal spray, Place 2 sprays into both nostrils daily as needed. , Disp: , Rfl:   Review of Systems  Constitutional: Negative for fatigue and fever.  HENT: Negative for congestion, rhinorrhea and sore throat.   Respiratory: Negative for cough and shortness of breath.   Gastrointestinal: Negative for anorexia and vomiting.  Musculoskeletal: Negative for joint pain.  Skin: Positive for rash. Negative for nail changes.    Social History   Tobacco Use  . Smoking status: Never Smoker  . Smokeless tobacco: Never Used  Substance Use Topics  . Alcohol use: Yes    Alcohol/week: 3.0 oz    Types: 5 Glasses of wine per week    Objective:   BP 108/64 (BP Location: Left Arm, Patient Position: Sitting, Cuff Size: Normal)   Pulse 76   Temp 97.8 F (36.6 C) (Oral)   Resp 16   LMP 07/10/2017  Vitals:   07/16/17 0830  BP: 108/64  Pulse: 76  Resp: 16  Temp: 97.8 F (36.6 C)  TempSrc: Oral     Physical Exam  Constitutional: She appears well-developed and well-nourished. No distress.  HENT:  Head: Normocephalic and atraumatic.  Right Ear: External ear normal.  Left Ear: External ear normal.  Mouth/Throat: Oropharynx is clear and moist.  Eyes: Conjunctivae are normal. No scleral icterus.  Neck: Neck supple.  Cardiovascular: Normal rate, regular rhythm, normal heart sounds and intact distal pulses.  Pulmonary/Chest: Effort normal and breath sounds normal. No respiratory distress. She has no wheezes. She has no rales.  Musculoskeletal: She exhibits no edema or deformity.  Lymphadenopathy:    She has no cervical adenopathy.  Neurological: She is alert.  Skin:  Raised, erythematous lesions on bilateral forearms and ankles. Right ankle area with secondary honey crusting on top of lesions and discharge of clear drainage  Psychiatric: She has a normal mood and affect. Her behavior is normal.  Vitals reviewed.       Assessment & Plan:      Problem List Items Addressed This Visit      Musculoskeletal and Integument   Poison ivy dermatitis    Rash consistent with poison ivy dermatitis with recent exposure Given extensive  lesions, treated with oral prednisone Will need slow 3 week taper to avoid rebound dermatitis May continue calamine or other topical lotions as needed Discussed return precautions including signs of infection      Impetigo    Small area of impetigo covering one of her lesions on her right ankle Treat with mupirocin ointment twice daily until resolution Discussed signs of worsening that would require visit No signs of systemic illness, is okay to treat with topicals at this time       Relevant Medications   mupirocin ointment (BACTROBAN) 2 %      Meds ordered this encounter  Medications  . predniSONE (DELTASONE) 20 MG tablet    Sig: Take 60mg  daily x1 week, then 40mg  daily x1 week, then 20mg  daily x1 week, then discontinue    Dispense:  42 tablet    Refill:  0  . mupirocin ointment (BACTROBAN) 2 %    Sig: Apply 1 application 2 (two) times daily topically. To R ankle    Dispense:  30 g    Refill:  0       The entirety of the information documented in the History of Present Illness, Review of Systems and Physical Exam were personally obtained by me. Portions of this information were initially documented by Irving BurtonEmily Ratchford, CMA and reviewed by me for thoroughness and accuracy.     Shirlee LatchAngela Jacky Dross, MD  [facility]

## 2017-07-16 NOTE — Patient Instructions (Signed)
Impetigo, Adult Impetigo is an infection of the skin. It commonly occurs in young children, but it can also occur in adults. The infection causes itchy blisters and sores that produce brownish-yellow fluid. As the fluid dries, it forms a thick, honey-colored crust. These skin changes usually occur on the face but can also affect other areas of the body. Impetigo usually goes away in 7-10 days with treatment. What are the causes? Impetigo is caused by two types of bacteria. It may be caused by staphylococci or streptococci bacteria. These bacteria cause impetigo when they get under the surface of the skin. This often happens after some damage to the skin, such as damage from:  Cuts, scrapes, or scratches.  Insect bites, especially when you scratch the area of a bite.  Chickenpox or other illnesses that cause open skin sores.  Nail biting or chewing.  Impetigo is contagious and can spread easily from one person to another. This may occur through close skin contact or by sharing towels, clothing, or other items with a person who has the infection. What increases the risk? Some things that can increase the risk of getting this infection include:  Playing sports that include skin-to-skin contact with others.  Having a skin condition with open sores.  Having many skin cuts or scrapes.  Living in an area that has high humidity levels.  Having poor hygiene.  Having high levels of staphylococci in your nose.  What are the signs or symptoms? Impetigo usually starts out as small blisters, often on the face. The blisters then break open and turn into tiny sores (lesions) with a yellow crust. In some cases, the blisters cause itching or burning. With scratching, irritation, or lack of treatment, these small lesions may get larger. Scratching can also cause impetigo to spread to other parts of the body. The bacteria can get under the fingernails and spread when you touch another area of your  skin. Other possible symptoms include:  Larger blisters.  Pus.  Swollen lymph glands.  How is this diagnosed? This condition is usually diagnosed during a physical exam. A skin sample or sample of fluid from a blister may be taken for lab tests that involve growing bacteria (culture test). This can help confirm the diagnosis or help determine the best treatment. How is this treated? Mild impetigo can be treated with prescription antibiotic cream. Oral antibiotic medicine may be used in more severe cases. Medicines for itching may also be used. Follow these instructions at home:  Take medicines only as directed by your health care provider.  To help prevent impetigo from spreading to other body areas: ? Keep your fingernails short and clean. ? Do not scratch the blisters or sores. ? Cover infected areas, if necessary, to keep from scratching.  Gently wash the infected areas with antibiotic soap and water.  Soak crusted areas in warm, soapy water using antibiotic soap. ? Gently rub the areas to remove crusts. Do not scrub.  Wash your hands often to avoid spreading this infection.  Stay home until you have used an antibiotic cream for 48 hours (2 days) or an oral antibiotic medicine for 24 hours (1 day). You should only return to work and activities with other people if your skin shows significant improvement. How is this prevented? To keep the infection from spreading:  Stay home until you have used an antibiotic cream for 48 hours or an oral antibiotic for 24 hours.  Wash your hands often.  Do not engage in   skin-to-skin contact with other people while you have still have blisters.  Do not share towels, washcloths, or bedding with others while you have the infection.  Contact a health care provider if:  You develop more blisters or sores despite treatment.  Other family members get sores.  Your skin sores are not improving after 48 hours of treatment.  You have a  fever. Get help right away if:  You see spreading redness or swelling of the skin around your sores.  You see red streaks coming from your sores.  You develop a sore throat. This information is not intended to replace advice given to you by your health care provider. Make sure you discuss any questions you have with your health care provider. Document Released: 09/17/2014 Document Revised: 02/02/2016 Document Reviewed: 08/10/2014 Elsevier Interactive Patient Education  2017 Elsevier Inc.  

## 2017-07-17 NOTE — Assessment & Plan Note (Signed)
Rash consistent with poison ivy dermatitis with recent exposure Given extensive lesions, treated with oral prednisone Will need slow 3 week taper to avoid rebound dermatitis May continue calamine or other topical lotions as needed Discussed return precautions including signs of infection

## 2017-07-17 NOTE — Assessment & Plan Note (Signed)
Small area of impetigo covering one of her lesions on her right ankle Treat with mupirocin ointment twice daily until resolution Discussed signs of worsening that would require visit No signs of systemic illness, is okay to treat with topicals at this time

## 2018-04-04 DIAGNOSIS — D2272 Melanocytic nevi of left lower limb, including hip: Secondary | ICD-10-CM | POA: Diagnosis not present

## 2018-04-04 DIAGNOSIS — D225 Melanocytic nevi of trunk: Secondary | ICD-10-CM | POA: Diagnosis not present

## 2018-04-04 DIAGNOSIS — D2262 Melanocytic nevi of left upper limb, including shoulder: Secondary | ICD-10-CM | POA: Diagnosis not present

## 2018-04-04 DIAGNOSIS — D2261 Melanocytic nevi of right upper limb, including shoulder: Secondary | ICD-10-CM | POA: Diagnosis not present

## 2018-05-06 LAB — BASIC METABOLIC PANEL: Glucose: 97

## 2018-05-06 LAB — LIPID PANEL
CHOLESTEROL: 161 (ref 0–200)
HDL: 66 (ref 35–70)
LDL Cholesterol: 85
TRIGLYCERIDES: 53 (ref 40–160)

## 2018-05-19 ENCOUNTER — Ambulatory Visit: Payer: BLUE CROSS/BLUE SHIELD | Admitting: Family Medicine

## 2018-05-19 ENCOUNTER — Encounter: Payer: Self-pay | Admitting: Family Medicine

## 2018-05-19 VITALS — BP 108/68 | HR 86 | Temp 98.3°F | Wt 157.8 lb

## 2018-05-19 DIAGNOSIS — Z862 Personal history of diseases of the blood and blood-forming organs and certain disorders involving the immune mechanism: Secondary | ICD-10-CM | POA: Diagnosis not present

## 2018-05-19 DIAGNOSIS — Z0000 Encounter for general adult medical examination without abnormal findings: Secondary | ICD-10-CM | POA: Diagnosis not present

## 2018-05-19 DIAGNOSIS — Z23 Encounter for immunization: Secondary | ICD-10-CM | POA: Diagnosis not present

## 2018-05-19 DIAGNOSIS — E039 Hypothyroidism, unspecified: Secondary | ICD-10-CM | POA: Diagnosis not present

## 2018-05-19 DIAGNOSIS — Z1231 Encounter for screening mammogram for malignant neoplasm of breast: Secondary | ICD-10-CM | POA: Diagnosis not present

## 2018-05-19 DIAGNOSIS — E7800 Pure hypercholesterolemia, unspecified: Secondary | ICD-10-CM | POA: Diagnosis not present

## 2018-05-19 DIAGNOSIS — E038 Other specified hypothyroidism: Secondary | ICD-10-CM

## 2018-05-19 DIAGNOSIS — Z803 Family history of malignant neoplasm of breast: Secondary | ICD-10-CM

## 2018-05-19 DIAGNOSIS — Z1239 Encounter for other screening for malignant neoplasm of breast: Secondary | ICD-10-CM

## 2018-05-19 DIAGNOSIS — R739 Hyperglycemia, unspecified: Secondary | ICD-10-CM

## 2018-05-19 NOTE — Assessment & Plan Note (Signed)
Not on any medications Cholesterol has decreased and is well-balanced with high HDL and LDL less than 100 Reviewed cholesterol panel from recent health screening at work Check CMP Continue regular exercise Recheck in 1 year

## 2018-05-19 NOTE — Assessment & Plan Note (Signed)
Asymptomatic and not on any medications Continue to monitor annual TSH

## 2018-05-19 NOTE — Assessment & Plan Note (Signed)
Fasting blood glucose on lab work from work is 9 Continue low-carb diet and exercise Check A1c

## 2018-05-19 NOTE — Patient Instructions (Signed)
Preventive Care 40-64 Years, Female Preventive care refers to lifestyle choices and visits with your health care provider that can promote health and wellness. What does preventive care include?  A yearly physical exam. This is also called an annual well check.  Dental exams once or twice a year.  Routine eye exams. Ask your health care provider how often you should have your eyes checked.  Personal lifestyle choices, including: ? Daily care of your teeth and gums. ? Regular physical activity. ? Eating a healthy diet. ? Avoiding tobacco and drug use. ? Limiting alcohol use. ? Practicing safe sex. ? Taking low-dose aspirin daily starting at age 58. ? Taking vitamin and mineral supplements as recommended by your health care provider. What happens during an annual well check? The services and screenings done by your health care provider during your annual well check will depend on your age, overall health, lifestyle risk factors, and family history of disease. Counseling Your health care provider may ask you questions about your:  Alcohol use.  Tobacco use.  Drug use.  Emotional well-being.  Home and relationship well-being.  Sexual activity.  Eating habits.  Work and work Statistician.  Method of birth control.  Menstrual cycle.  Pregnancy history.  Screening You may have the following tests or measurements:  Height, weight, and BMI.  Blood pressure.  Lipid and cholesterol levels. These may be checked every 5 years, or more frequently if you are over 81 years old.  Skin check.  Lung cancer screening. You may have this screening every year starting at age 78 if you have a 30-pack-year history of smoking and currently smoke or have quit within the past 15 years.  Fecal occult blood test (FOBT) of the stool. You may have this test every year starting at age 65.  Flexible sigmoidoscopy or colonoscopy. You may have a sigmoidoscopy every 5 years or a colonoscopy  every 10 years starting at age 30.  Hepatitis C blood test.  Hepatitis B blood test.  Sexually transmitted disease (STD) testing.  Diabetes screening. This is done by checking your blood sugar (glucose) after you have not eaten for a while (fasting). You may have this done every 1-3 years.  Mammogram. This may be done every 1-2 years. Talk to your health care provider about when you should start having regular mammograms. This may depend on whether you have a family history of breast cancer.  BRCA-related cancer screening. This may be done if you have a family history of breast, ovarian, tubal, or peritoneal cancers.  Pelvic exam and Pap test. This may be done every 3 years starting at age 80. Starting at age 36, this may be done every 5 years if you have a Pap test in combination with an HPV test.  Bone density scan. This is done to screen for osteoporosis. You may have this scan if you are at high risk for osteoporosis.  Discuss your test results, treatment options, and if necessary, the need for more tests with your health care provider. Vaccines Your health care provider may recommend certain vaccines, such as:  Influenza vaccine. This is recommended every year.  Tetanus, diphtheria, and acellular pertussis (Tdap, Td) vaccine. You may need a Td booster every 10 years.  Varicella vaccine. You may need this if you have not been vaccinated.  Zoster vaccine. You may need this after age 5.  Measles, mumps, and rubella (MMR) vaccine. You may need at least one dose of MMR if you were born in  1957 or later. You may also need a second dose.  Pneumococcal 13-valent conjugate (PCV13) vaccine. You may need this if you have certain conditions and were not previously vaccinated.  Pneumococcal polysaccharide (PPSV23) vaccine. You may need one or two doses if you smoke cigarettes or if you have certain conditions.  Meningococcal vaccine. You may need this if you have certain  conditions.  Hepatitis A vaccine. You may need this if you have certain conditions or if you travel or work in places where you may be exposed to hepatitis A.  Hepatitis B vaccine. You may need this if you have certain conditions or if you travel or work in places where you may be exposed to hepatitis B.  Haemophilus influenzae type b (Hib) vaccine. You may need this if you have certain conditions.  Talk to your health care provider about which screenings and vaccines you need and how often you need them. This information is not intended to replace advice given to you by your health care provider. Make sure you discuss any questions you have with your health care provider. Document Released: 09/23/2015 Document Revised: 05/16/2016 Document Reviewed: 06/28/2015 Elsevier Interactive Patient Education  2018 Elsevier Inc.  

## 2018-05-19 NOTE — Assessment & Plan Note (Signed)
Asymptomatic currently After having IUD for 7 years, she is now having menses again We will check CBC

## 2018-05-19 NOTE — Progress Notes (Signed)
Patient: Krista Mosley Female    DOB: 1971/01/12   47 y.o.   MRN: [id number] Visit Date: 05/19/2018  Today's Provider: Lavon Paganini, MD   Chief Complaint  Patient presents with  . Hypothyroidism  . Hyperlipidemia   Subjective:    I, Tiburcio Pea, CMA, am acting as a Education administrator for Lavon Paganini, MD.   HPI  Hypothyroid, follow-up: Lab Results  Component Value Date   TSH 1.60 05/17/2017    Wt Readings from Last 3 Encounters:  05/19/18 157 lb 12.8 oz (71.6 kg)  05/17/17 154 lb (69.9 kg)  05/07/17 155 lb (70.3 kg)    She was last seen for hypothyroid 1 years ago.  Management since that visit includes no changes She reports good compliance with treatment. She is not having side effects.  She is exercising lightly. She is experiencing none She denies change in energy level, diarrhea, heat / cold intolerance, nervousness, palpitations and weight changes Weight trend: stable  ------------------------------------------------------------------------  Lipid/Cholesterol, Follow-up:   Last seen for this1 years ago.  Management changes since that visit include no changes. . Last Lipid Panel:    Component Value Date/Time   CHOL 161 05/06/2018   CHOL 203 (H) 03/07/2016 1013   TRIG 53 05/06/2018   HDL 66 05/06/2018   HDL 83 03/07/2016 1013   CHOLHDL 2.6 CALC 06/02/2008 0952   VLDL 8 06/02/2008 0952   LDLCALC 85 05/06/2018   LDLCALC 98 03/07/2016 1013    Risk factors for vascular disease include hypercholesterolemia  She reports good compliance with treatment. She is not having side effects.  Current symptoms include none  Weight trend: stable Prior visit with dietician: no Current diet: well balanced Current exercise: cardiovascular workout on exercise equipment  Wt Readings from Last 3 Encounters:  05/19/18 157 lb 12.8 oz (71.6 kg)  05/17/17 154 lb (69.9 kg)  05/07/17 155 lb (70.3 kg)    -------------------------------------------------------------------  Last pap smear 05/07/2017-normal and HPV negative Last mammogram in 2011-normal Her mother has history of invasive lobular breast cancer that was initially found at age 47.  It is ER positive, PR negative, and HER-2 negative.  She now has had a recurrence after being in remission for about 10 years.  Patient also has a paternal aunt with history of breast cancer.    Allergies  Allergen Reactions  . Codeine     REACTION: NAUSEA + VOMITING     Current Outpatient Medications:  .  Cetirizine HCl (ZYRTEC ALLERGY) 10 MG CAPS, Take by mouth., Disp: , Rfl:  .  fluticasone (FLONASE) 50 MCG/ACT nasal spray, Place 2 sprays into both nostrils daily as needed. , Disp: , Rfl:   Review of Systems  Constitutional: Negative.   HENT: Negative.   Eyes: Negative.   Respiratory: Negative.   Cardiovascular: Negative.   Gastrointestinal: Negative.   Endocrine: Negative.   Genitourinary: Negative.   Musculoskeletal: Negative.   Skin: Negative.   Neurological: Negative.   Hematological: Negative.   Psychiatric/Behavioral: Negative.     Social History   Tobacco Use  . Smoking status: Never Smoker  . Smokeless tobacco: Never Used  Substance Use Topics  . Alcohol use: Yes    Alcohol/week: 5.0 standard drinks    Types: 5 Glasses of wine per week   Objective:   BP 108/68 (BP Location: Right Arm, Patient Position: Sitting, Cuff Size: Normal)   Pulse 86   Temp 98.3 F (36.8 C) (Oral)   Wt 157 lb 12.8  oz (71.6 kg)   SpO2 96%   BMI 27.09 kg/m  Vitals:   05/19/18 0841  BP: 108/68  Pulse: 86  Temp: 98.3 F (36.8 C)  TempSrc: Oral  SpO2: 96%  Weight: 157 lb 12.8 oz (71.6 kg)     Physical Exam  Constitutional: She is oriented to person, place, and time. She appears well-developed and well-nourished. No distress.  HENT:  Head: Normocephalic and atraumatic.  Right Ear: External ear normal.  Left Ear: External ear  normal.  Nose: Nose normal.  Mouth/Throat: Oropharynx is clear and moist.  Eyes: Pupils are equal, round, and reactive to light. Conjunctivae are normal. No scleral icterus.  Neck: Neck supple. No thyromegaly present.  Cardiovascular: Normal rate, regular rhythm, normal heart sounds and intact distal pulses.  No murmur heard. Pulmonary/Chest: Effort normal and breath sounds normal. No respiratory distress. She has no wheezes. She has no rales.  Abdominal: Soft. She exhibits no distension. There is no tenderness.  Musculoskeletal: She exhibits no edema or deformity.  Lymphadenopathy:    She has no cervical adenopathy.  Neurological: She is alert and oriented to person, place, and time.  Skin: Skin is warm and dry. Capillary refill takes less than 2 seconds. No rash noted.  Psychiatric: She has a normal mood and affect. Her behavior is normal.  Vitals reviewed.    Depression Screen Depression screen [facility] 2/9 03/07/2016  Decreased Interest 0  Down, Depressed, Hopeless 0  PHQ - 2 Score 0     Assessment & Plan:     Routine Health Maintenance and Physical Exam  Exercise Activities and Dietary recommendations Goals   None      Immunization History  Administered Date(s) Administered  . Influenza Split 06/07/2009, 05/27/2010, 06/09/2011, 07/15/2012  . Influenza Whole 08/06/2005  . Influenza,inj,Quad PF,6+ Mos 07/18/2013, 07/15/2014, 07/23/2015  . Influenza-Unspecified 06/12/2017  . Td 08/06/2005, 06/16/2011, 01/10/2016  . Tdap 06/16/2011, 01/10/2016     Health Maintenance  Topic Date Due  . INFLUENZA VACCINE  04/10/2018  . PAP SMEAR  05/07/2020  . TETANUS/TDAP  01/09/2026  . HIV Screening  Completed    Discussed health benefits of physical activity, and encouraged her to engage in regular exercise appropriate for her age and condition.    Problem List Items Addressed This Visit      Endocrine   Subclinical hypothyroidism - Primary    Asymptomatic and not on any  medications Continue to monitor annual TSH      Relevant Orders   TSH     Other   Hypercholesteremia    Not on any medications Cholesterol has decreased and is well-balanced with high HDL and LDL less than 100 Reviewed cholesterol panel from recent health screening at work Check CMP Continue regular exercise Recheck in 1 year      Relevant Orders   Comprehensive metabolic panel   History of anemia    Asymptomatic currently After having IUD for 7 years, she is now having menses again We will check CBC      Relevant Orders   CBC w/Diff/Platelet   Blood glucose elevated    Fasting blood glucose on lab work from work is 43 Continue low-carb diet and exercise Check A1c      Relevant Orders   Hemoglobin A1c   Annual physical exam   Family history of breast cancer in mother    Needs regular screening mammograms      Relevant Orders   MM 3D SCREEN BREAST BILATERAL  Other Visit Diagnoses    Screening for breast cancer       Relevant Orders   MM 3D SCREEN BREAST BILATERAL   Need for influenza vaccination       Relevant Orders   Flu Vaccine QUAD 36+ mos IM       Return in about 1 year (around 05/20/2019) for chronic disease f/u and CPE.   The entirety of the information documented in the History of Present Illness, Review of Systems and Physical Exam were personally obtained by me. Portions of this information were initially documented by Tiburcio Pea, CMA and reviewed by me for thoroughness and accuracy.    Virginia Crews, MD, MPH [facility] 05/19/2018 9:12 AM

## 2018-05-19 NOTE — Assessment & Plan Note (Signed)
Needs regular screening mammograms

## 2018-05-20 LAB — COMPREHENSIVE METABOLIC PANEL
A/G RATIO: 2.5 — AB (ref 1.2–2.2)
ALBUMIN: 4.7 g/dL (ref 3.5–5.5)
ALT: 12 IU/L (ref 0–32)
AST: 10 IU/L (ref 0–40)
Alkaline Phosphatase: 51 IU/L (ref 39–117)
BUN / CREAT RATIO: 23 (ref 9–23)
BUN: 17 mg/dL (ref 6–24)
Bilirubin Total: 0.9 mg/dL (ref 0.0–1.2)
CHLORIDE: 103 mmol/L (ref 96–106)
CO2: 21 mmol/L (ref 20–29)
Calcium: 9.8 mg/dL (ref 8.7–10.2)
Creatinine, Ser: 0.73 mg/dL (ref 0.57–1.00)
GFR, EST AFRICAN AMERICAN: 115 mL/min/{1.73_m2} (ref >59)
GFR, EST NON AFRICAN AMERICAN: 100 mL/min/{1.73_m2} (ref >59)
GLOBULIN, TOTAL: 1.9 g/dL (ref 1.5–4.5)
Glucose: 93 mg/dL (ref 65–99)
Potassium: 4.4 mmol/L (ref 3.5–5.2)
SODIUM: 137 mmol/L (ref 134–144)
Total Protein: 6.6 g/dL (ref 6.0–8.5)

## 2018-05-20 LAB — CBC WITH DIFFERENTIAL/PLATELET
BASOS: 1 %
Basophils Absolute: 0 10*3/uL (ref 0.0–0.2)
EOS (ABSOLUTE): 0.1 10*3/uL (ref 0.0–0.4)
Eos: 1 %
Hematocrit: 41.8 % (ref 34.0–46.6)
Hemoglobin: 13.8 g/dL (ref 11.1–15.9)
IMMATURE GRANS (ABS): 0 10*3/uL (ref 0.0–0.1)
Immature Granulocytes: 0 %
LYMPHS ABS: 1.7 10*3/uL (ref 0.7–3.1)
Lymphs: 41 %
MCH: 30.7 pg (ref 26.6–33.0)
MCHC: 33 g/dL (ref 31.5–35.7)
MCV: 93 fL (ref 79–97)
Monocytes Absolute: 0 10*3/uL — ABNORMAL LOW (ref 0.1–0.9)
Monocytes: 1 %
NEUTROS ABS: 2.3 10*3/uL (ref 1.4–7.0)
Neutrophils: 56 %
PLATELETS: 283 10*3/uL (ref 150–450)
RBC: 4.49 x10E6/uL (ref 3.77–5.28)
RDW: 14.1 % (ref 12.3–15.4)
WBC: 4.1 10*3/uL (ref 3.4–10.8)

## 2018-05-20 LAB — TSH: TSH: 1.88 u[IU]/mL (ref 0.450–4.500)

## 2018-05-20 LAB — HEMOGLOBIN A1C
Est. average glucose Bld gHb Est-mCnc: 105 mg/dL
Hgb A1c MFr Bld: 5.3 % (ref 4.8–5.6)

## 2018-06-04 ENCOUNTER — Ambulatory Visit
Admission: RE | Admit: 2018-06-04 | Discharge: 2018-06-04 | Disposition: A | Discharge status: Home / Self Care | Payer: BLUE CROSS/BLUE SHIELD | Source: Ambulatory Visit | Attending: Family Medicine | Admitting: Family Medicine

## 2018-06-04 DIAGNOSIS — Z1239 Encounter for other screening for malignant neoplasm of breast: Secondary | ICD-10-CM

## 2018-06-04 DIAGNOSIS — Z1231 Encounter for screening mammogram for malignant neoplasm of breast: Secondary | ICD-10-CM | POA: Diagnosis not present

## 2018-06-04 DIAGNOSIS — Z803 Family history of malignant neoplasm of breast: Secondary | ICD-10-CM | POA: Diagnosis not present

## 2018-10-22 DIAGNOSIS — Z20828 Contact with and (suspected) exposure to other viral communicable diseases: Secondary | ICD-10-CM | POA: Diagnosis not present

## 2019-02-26 ENCOUNTER — Other Ambulatory Visit: Payer: Self-pay | Admitting: Physician Assistant

## 2019-02-26 NOTE — Progress Notes (Signed)
Patient exposed to positive covid person on 02/21/19. No symptoms currently. Testing requested.

## 2019-02-27 ENCOUNTER — Other Ambulatory Visit: Payer: Self-pay

## 2019-02-27 ENCOUNTER — Telehealth: Payer: Self-pay | Admitting: *Deleted

## 2019-02-27 DIAGNOSIS — Z20822 Contact with and (suspected) exposure to covid-19: Secondary | ICD-10-CM

## 2019-02-27 NOTE — Telephone Encounter (Signed)
Scheduled for covid testing today @ the [employer]. Instructions given and order placed

## 2019-03-03 LAB — NOVEL CORONAVIRUS, NAA: SARS-CoV-2, NAA: NOT DETECTED

## 2019-04-03 DIAGNOSIS — L309 Dermatitis, unspecified: Secondary | ICD-10-CM | POA: Diagnosis not present

## 2019-04-03 DIAGNOSIS — D2261 Melanocytic nevi of right upper limb, including shoulder: Secondary | ICD-10-CM | POA: Diagnosis not present

## 2019-04-03 DIAGNOSIS — D2262 Melanocytic nevi of left upper limb, including shoulder: Secondary | ICD-10-CM | POA: Diagnosis not present

## 2019-04-03 DIAGNOSIS — D2271 Melanocytic nevi of right lower limb, including hip: Secondary | ICD-10-CM | POA: Diagnosis not present

## 2019-04-21 ENCOUNTER — Other Ambulatory Visit: Payer: Self-pay | Admitting: Family Medicine

## 2019-05-28 LAB — LIPID PANEL
Cholesterol: 210 — AB (ref 0–200)
HDL: 90 — AB (ref 35–70)
LDL Cholesterol: 101
Triglycerides: 96 (ref 40–160)

## 2019-05-28 LAB — BASIC METABOLIC PANEL: Glucose: 106

## 2019-08-19 ENCOUNTER — Other Ambulatory Visit: Payer: Self-pay

## 2019-08-19 DIAGNOSIS — Z20822 Contact with and (suspected) exposure to covid-19: Secondary | ICD-10-CM

## 2019-08-20 LAB — NOVEL CORONAVIRUS, NAA: SARS-CoV-2, NAA: NOT DETECTED

## 2019-08-26 ENCOUNTER — Encounter: Payer: BLUE CROSS/BLUE SHIELD | Admitting: Family Medicine

## 2019-08-26 ENCOUNTER — Ambulatory Visit (INDEPENDENT_AMBULATORY_CARE_PROVIDER_SITE_OTHER): Payer: BC Managed Care – PPO | Admitting: Family Medicine

## 2019-08-26 ENCOUNTER — Other Ambulatory Visit: Payer: Self-pay

## 2019-08-26 ENCOUNTER — Encounter: Payer: Self-pay | Admitting: Family Medicine

## 2019-08-26 VITALS — BP 115/75 | HR 81 | Temp 96.9°F | Resp 16 | Ht 63.0 in | Wt 158.0 lb

## 2019-08-26 DIAGNOSIS — Z862 Personal history of diseases of the blood and blood-forming organs and certain disorders involving the immune mechanism: Secondary | ICD-10-CM | POA: Diagnosis not present

## 2019-08-26 DIAGNOSIS — E039 Hypothyroidism, unspecified: Secondary | ICD-10-CM | POA: Diagnosis not present

## 2019-08-26 DIAGNOSIS — E663 Overweight: Secondary | ICD-10-CM | POA: Insufficient documentation

## 2019-08-26 DIAGNOSIS — R739 Hyperglycemia, unspecified: Secondary | ICD-10-CM

## 2019-08-26 DIAGNOSIS — Z0000 Encounter for general adult medical examination without abnormal findings: Secondary | ICD-10-CM | POA: Diagnosis not present

## 2019-08-26 DIAGNOSIS — E038 Other specified hypothyroidism: Secondary | ICD-10-CM

## 2019-08-26 DIAGNOSIS — Z1231 Encounter for screening mammogram for malignant neoplasm of breast: Secondary | ICD-10-CM

## 2019-08-26 DIAGNOSIS — E7800 Pure hypercholesterolemia, unspecified: Secondary | ICD-10-CM

## 2019-08-26 NOTE — Assessment & Plan Note (Signed)
Fasting blood glucose on work screening 106 Recheck A1c

## 2019-08-26 NOTE — Assessment & Plan Note (Signed)
Asymptomatic Not on any medications Continue to monitor annual TSH

## 2019-08-26 NOTE — Assessment & Plan Note (Signed)
Not on any medications Reviewed screening cholesterol Well balanced with high HDL Check CMP Continue exercise F/u in 1 yr

## 2019-08-26 NOTE — Patient Instructions (Signed)
Health Maintenance, Female Adopting a healthy lifestyle and getting preventive care are important in promoting health and wellness. Ask your health care provider about:  The right schedule for you to have regular tests and exams.  Things you can do on your own to prevent diseases and keep yourself healthy. What should I know about diet, weight, and exercise? Eat a healthy diet   Eat a diet that includes plenty of vegetables, fruits, low-fat dairy products, and lean protein.  Do not eat a lot of foods that are high in solid fats, added sugars, or sodium. Maintain a healthy weight Body mass index (BMI) is used to identify weight problems. It estimates body fat based on height and weight. Your health care provider can help determine your BMI and help you achieve or maintain a healthy weight. Get regular exercise Get regular exercise. This is one of the most important things you can do for your health. Most adults should:  Exercise for at least 150 minutes each week. The exercise should increase your heart rate and make you sweat (moderate-intensity exercise).  Do strengthening exercises at least twice a week. This is in addition to the moderate-intensity exercise.  Spend less time sitting. Even light physical activity can be beneficial. Watch cholesterol and blood lipids Have your blood tested for lipids and cholesterol at 47 years of age, then have this test every 5 years. Have your cholesterol levels checked more often if:  Your lipid or cholesterol levels are high.  You are older than 47 years of age.  You are at high risk for heart disease. What should I know about cancer screening? Depending on your health history and family history, you may need to have cancer screening at various ages. This may include screening for:  Breast cancer.  Cervical cancer.  Colorectal cancer.  Skin cancer.  Lung cancer. What should I know about heart disease, diabetes, and high blood  pressure? Blood pressure and heart disease  High blood pressure causes heart disease and increases the risk of stroke. This is more likely to develop in people who have high blood pressure readings, are of African descent, or are overweight.  Have your blood pressure checked: ? Every 3-5 years if you are 18-39 years of age. ? Every year if you are 40 years old or older. Diabetes Have regular diabetes screenings. This checks your fasting blood sugar level. Have the screening done:  Once every three years after age 40 if you are at a normal weight and have a low risk for diabetes.  More often and at a younger age if you are overweight or have a high risk for diabetes. What should I know about preventing infection? Hepatitis B If you have a higher risk for hepatitis B, you should be screened for this virus. Talk with your health care provider to find out if you are at risk for hepatitis B infection. Hepatitis C Testing is recommended for:  Everyone born from 1945 through 1965.  Anyone with known risk factors for hepatitis C. Sexually transmitted infections (STIs)  Get screened for STIs, including gonorrhea and chlamydia, if: ? You are sexually active and are younger than 47 years of age. ? You are older than 47 years of age and your health care provider tells you that you are at risk for this type of infection. ? Your sexual activity has changed since you were last screened, and you are at increased risk for chlamydia or gonorrhea. Ask your health care provider if   you are at risk.  Ask your health care provider about whether you are at high risk for HIV. Your health care provider may recommend a prescription medicine to help prevent HIV infection. If you choose to take medicine to prevent HIV, you should first get tested for HIV. You should then be tested every 3 months for as long as you are taking the medicine. Pregnancy  If you are about to stop having your period (premenopausal) and  you may become pregnant, seek counseling before you get pregnant.  Take 400 to 800 micrograms (mcg) of folic acid every day if you become pregnant.  Ask for birth control (contraception) if you want to prevent pregnancy. Osteoporosis and menopause Osteoporosis is a disease in which the bones lose minerals and strength with aging. This can result in bone fractures. If you are 65 years old or older, or if you are at risk for osteoporosis and fractures, ask your health care provider if you should:  Be screened for bone loss.  Take a calcium or vitamin D supplement to lower your risk of fractures.  Be given hormone replacement therapy (HRT) to treat symptoms of menopause. Follow these instructions at home: Lifestyle  Do not use any products that contain nicotine or tobacco, such as cigarettes, e-cigarettes, and chewing tobacco. If you need help quitting, ask your health care provider.  Do not use street drugs.  Do not share needles.  Ask your health care provider for help if you need support or information about quitting drugs. Alcohol use  Do not drink alcohol if: ? Your health care provider tells you not to drink. ? You are pregnant, may be pregnant, or are planning to become pregnant.  If you drink alcohol: ? Limit how much you use to 0-1 drink a day. ? Limit intake if you are breastfeeding.  Be aware of how much alcohol is in your drink. In the [place] one drink equals one 12 oz bottle of beer (355 mL), one 5 oz glass of wine (148 mL), or one 1 oz glass of hard liquor (44 mL). General instructions  Schedule regular health, dental, and eye exams.  Stay current with your vaccines.  Tell your health care provider if: ? You often feel depressed. ? You have ever been abused or do not feel safe at home. Summary  Adopting a healthy lifestyle and getting preventive care are important in promoting health and wellness.  Follow your health care provider's instructions about healthy  diet, exercising, and getting tested or screened for diseases.  Follow your health care provider's instructions on monitoring your cholesterol and blood pressure. This information is not intended to replace advice given to you by your health care provider. Make sure you discuss any questions you have with your health care provider. Document Released: 03/12/2011 Document Revised: 08/20/2018 Document Reviewed: 08/20/2018 Elsevier Patient Education  2020 Elsevier Inc.  

## 2019-08-26 NOTE — Assessment & Plan Note (Signed)
Continue regular exercise Screening labs

## 2019-08-26 NOTE — Progress Notes (Signed)
Patient: Krista Mosley, Female    DOB: 02-Jun-1971, 48 y.o.   MRN: [id number] Visit Date: 08/26/2019  Today's Provider: Lavon Paganini, MD   Chief Complaint  Patient presents with  . Annual Exam   Subjective:    I [place] S. Dimas, CMA, am acting as scribe for Lavon Paganini, MD.   Annual physical exam Krista Mosley is a 48 y.o. female who presents today for health maintenance and complete physical. She feels well. She reports exercising . She reports she is sleeping well. 03/07/2016 CPE 05/07/2017 Pap/HPV-negative 06/04/2018 Mammogram-BI-RADS 1  She is perimenopausal.  -----------------------------------------------------------------   Review of Systems  Constitutional: Negative.   HENT: Negative.   Eyes: Negative.   Respiratory: Negative.   Cardiovascular: Negative.   Gastrointestinal: Negative.   Endocrine: Negative.   Genitourinary: Negative.   Musculoskeletal: Negative.   Skin: Negative.   Allergic/Immunologic: Positive for environmental allergies.  Neurological: Negative.   Hematological: Negative.   Psychiatric/Behavioral: Negative.     Social History      She  reports that she has never smoked. She has never used smokeless tobacco. She reports current alcohol use of about 5.0 standard drinks of alcohol per week. She reports that she does not use drugs.       Social History   Socioeconomic History  . Marital status: Married    Spouse name: Not on file  . Number of children: 2  . Years of education: Not on file  . Highest education level: Not on file  Occupational History  . Not on file  Tobacco Use  . Smoking status: Never Smoker  . Smokeless tobacco: Never Used  Substance and Sexual Activity  . Alcohol use: Yes    Alcohol/week: 5.0 standard drinks    Types: 5 Glasses of wine per week  . Drug use: No  . Sexual activity: Yes    Birth control/protection: None  Other Topics Concern  . Not on file  Social History Narrative  . Not  on file   Social Determinants of Health   Financial Resource Strain:   . Difficulty of Paying Living Expenses: Not on file  Food Insecurity:   . Worried About Charity fundraiser in the Last Year: Not on file  . Ran Out of Food in the Last Year: Not on file  Transportation Needs:   . Lack of Transportation (Medical): Not on file  . Lack of Transportation (Non-Medical): Not on file  Physical Activity:   . Days of Exercise per Week: Not on file  . Minutes of Exercise per Session: Not on file  Stress:   . Feeling of Stress : Not on file  Social Connections:   . Frequency of Communication with Friends and Family: Not on file  . Frequency of Social Gatherings with Friends and Family: Not on file  . Attends Religious Services: Not on file  . Active Member of Clubs or Organizations: Not on file  . Attends Archivist Meetings: Not on file  . Marital Status: Not on file    No past medical history on file.   Patient Active Problem List   Diagnosis Date Noted  . Overweight 08/26/2019  . Family history of breast cancer in mother 05/19/2018  . Annual physical exam 05/07/2017  . History of anemia 01/25/2016  . Ovarian retention cyst 01/25/2016  . 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 Blood glucose elevated 01/25/2016  . HEADACHE 03/16/2009  . Subclinical hypothyroidism 05/21/2007  .  Hypercholesteremia 05/20/2007  . ALLERGIC RHINITIS 05/20/2007    Past Surgical History:  Procedure Laterality Date  . CESAREAN SECTION    . cryotherapy    . EXTERNAL EAR SURGERY    . KNEE SURGERY    . LEEP    . MENISCUS REPAIR      Family History        Family Status  Relation Name Status  . Mother  Alive  . Father  Alive  . Brother  Alive  . Brother  Alive  . Daughter  Alive  . Emelda Brothers  (Not Specified)        Her family history includes Breast cancer (age of onset: 72) in her mother and paternal aunt.      Allergies  Allergen Reactions  . Codeine     REACTION: NAUSEA + VOMITING      Current Outpatient Medications:  .  Cetirizine HCl (ZYRTEC ALLERGY) 10 MG CAPS, Take by mouth., Disp: , Rfl:  .  metroNIDAZOLE (METROGEL) 1 % gel, Apply 1 application topically daily., Disp: , Rfl:    Patient Care Team: Erasmo Downer, MD as PCP - General (Family Medicine)    Objective:    Vitals: BP 115/75 (BP Location: Left Arm, Patient Position: Sitting, Cuff Size: Normal)   Pulse 81   Temp (!) 96.9 F (36.1 C) (Temporal)   Resp 16   Ht 5\' 3"  (1.6 m)   Wt 158 lb (71.7 kg)   BMI 27.99 kg/m    Vitals:   08/26/19 0933  BP: 115/75  Pulse: 81  Resp: 16  Temp: (!) 96.9 F (36.1 C)  TempSrc: Temporal  Weight: 158 lb (71.7 kg)  Height: 5\' 3"  (1.6 m)     Physical Exam Vitals reviewed.  Constitutional:      General: She is not in acute distress.    Appearance: Normal appearance. She is well-developed. She is not diaphoretic.  HENT:     Head: Normocephalic and atraumatic.     Right Ear: Tympanic membrane, ear canal and external ear normal.     Left Ear: Tympanic membrane, ear canal and external ear normal.  Eyes:     General: No scleral icterus.    Conjunctiva/sclera: Conjunctivae normal.     Pupils: Pupils are equal, round, and reactive to light.  Neck:     Thyroid: No thyromegaly.  Cardiovascular:     Rate and Rhythm: Normal rate and regular rhythm.     Heart sounds: Normal heart sounds. No murmur.  Pulmonary:     Effort: Pulmonary effort is normal. No respiratory distress.     Breath sounds: Normal breath sounds. No wheezing or rales.  Abdominal:     General: There is no distension.     Palpations: Abdomen is soft.     Tenderness: There is no abdominal tenderness. There is no guarding or rebound.  Genitourinary:    Comments: Breasts: breasts appear normal, no suspicious masses, no skin or nipple changes or axillary nodes.  Musculoskeletal:        General: No deformity.     Cervical back: Neck supple.     Right lower leg: No edema.     Left lower  leg: No edema.  Lymphadenopathy:     Cervical: No cervical adenopathy.  Skin:    General: Skin is warm and dry.     Capillary Refill: Capillary refill takes less than 2 seconds.     Findings: No rash.  Neurological:  Mental Status: She is alert and oriented to person, place, and time. Mental status is at baseline.  Psychiatric:        Mood and Affect: Mood normal.        Behavior: Behavior normal.        Thought Content: Thought content normal.      Depression Screen PHQ 2/9 Scores 08/26/2019 05/19/2018 03/07/2016  PHQ - 2 Score 0 0 0  PHQ- 9 Score 0 0 -       Assessment & Plan:     Routine Health Maintenance and Physical Exam  Exercise Activities and Dietary recommendations Goals   None     Immunization History  Administered Date(s) Administered  . Influenza Split 06/07/2009, 05/27/2010, 06/09/2011, 07/15/2012  . Influenza Whole 08/06/2005  . Influenza,inj,Quad PF,6+ Mos 07/18/2013, 07/15/2014, 07/23/2015, 05/19/2018  . Influenza-Unspecified 06/12/2017, 06/19/2019  . Td 08/06/2005, 06/16/2011, 01/10/2016  . Tdap 06/16/2011, 01/10/2016    Health Maintenance  Topic Date Due  . PAP SMEAR-Modifier  05/07/2022  . TETANUS/TDAP  01/09/2026  . INFLUENZA VACCINE  Completed  . HIV Screening  Completed     Discussed health benefits of physical activity, and encouraged her to engage in regular exercise appropriate for her age and condition.    --------------------------------------------------------------------  Problem List Items Addressed This Visit      Endocrine   Subclinical hypothyroidism    Asymptomatic Not on any medications Continue to monitor annual TSH      Relevant Orders   TSH     Other   Hypercholesteremia    Not on any medications Reviewed screening cholesterol Well balanced with high HDL Check CMP Continue exercise F/u in 1 yr      Relevant Orders   Comprehensive metabolic panel   History of anemia    Asymptomatic Irregular  menses - perimenopausal Recheck annual CBC      Relevant Orders   CBC with Differential/Platelet   Blood glucose elevated    Fasting blood glucose on work screening 106 Recheck A1c      Relevant Orders   Hemoglobin A1c   Annual physical exam - Primary   Relevant Orders   Comprehensive metabolic panel   Overweight    Continue regular exercise Screening labs       Other Visit Diagnoses    Encounter for screening mammogram for malignant neoplasm of breast       Relevant Orders   MM 3D SCREEN BREAST BILATERAL       Return in 1 year (on 08/25/2020) for CPE.   The entirety of the information documented in the History of Present Illness, Review of Systems and Physical Exam were personally obtained by me. Portions of this information were initially documented by Rondel BatonSulibeya Dimas, CMA and reviewed by me for thoroughness and accuracy.    Nickol Collister, Marzella SchleinAngela M, MD MPH [facility]

## 2019-08-26 NOTE — Assessment & Plan Note (Signed)
Asymptomatic Irregular menses - perimenopausal Recheck annual CBC

## 2019-08-27 ENCOUNTER — Telehealth: Payer: Self-pay

## 2019-08-27 LAB — CBC WITH DIFFERENTIAL/PLATELET
Basophils Absolute: 0 10*3/uL (ref 0.0–0.2)
Basos: 1 %
EOS (ABSOLUTE): 0.1 10*3/uL (ref 0.0–0.4)
Eos: 1 %
Hematocrit: 41.9 % (ref 34.0–46.6)
Hemoglobin: 14.3 g/dL (ref 11.1–15.9)
Immature Grans (Abs): 0 10*3/uL (ref 0.0–0.1)
Immature Granulocytes: 0 %
Lymphocytes Absolute: 1.3 10*3/uL (ref 0.7–3.1)
Lymphs: 32 %
MCH: 31.5 pg (ref 26.6–33.0)
MCHC: 34.1 g/dL (ref 31.5–35.7)
MCV: 92 fL (ref 79–97)
Monocytes Absolute: 0.3 10*3/uL (ref 0.1–0.9)
Monocytes: 6 %
Neutrophils Absolute: 2.5 10*3/uL (ref 1.4–7.0)
Neutrophils: 60 %
Platelets: 331 10*3/uL (ref 150–450)
RBC: 4.54 x10E6/uL (ref 3.77–5.28)
RDW: 12.4 % (ref 11.7–15.4)
WBC: 4.2 10*3/uL (ref 3.4–10.8)

## 2019-08-27 LAB — COMPREHENSIVE METABOLIC PANEL
ALT: 12 IU/L (ref 0–32)
AST: 13 IU/L (ref 0–40)
Albumin/Globulin Ratio: 2.3 — ABNORMAL HIGH (ref 1.2–2.2)
Albumin: 4.8 g/dL (ref 3.8–4.8)
Alkaline Phosphatase: 58 IU/L (ref 39–117)
BUN/Creatinine Ratio: 25 — ABNORMAL HIGH (ref 9–23)
BUN: 17 mg/dL (ref 6–24)
Bilirubin Total: 0.9 mg/dL (ref 0.0–1.2)
CO2: 20 mmol/L (ref 20–29)
Calcium: 9.5 mg/dL (ref 8.7–10.2)
Chloride: 102 mmol/L (ref 96–106)
Creatinine, Ser: 0.67 mg/dL (ref 0.57–1.00)
GFR calc Af Amer: 121 mL/min/{1.73_m2} (ref >59)
GFR calc non Af Amer: 105 mL/min/{1.73_m2} (ref >59)
Globulin, Total: 2.1 g/dL (ref 1.5–4.5)
Glucose: 84 mg/dL (ref 65–99)
Potassium: 4.3 mmol/L (ref 3.5–5.2)
Sodium: 139 mmol/L (ref 134–144)
Total Protein: 6.9 g/dL (ref 6.0–8.5)

## 2019-08-27 LAB — HEMOGLOBIN A1C
Est. average glucose Bld gHb Est-mCnc: 97 mg/dL
Hgb A1c MFr Bld: 5 % (ref 4.8–5.6)

## 2019-08-27 LAB — TSH: TSH: 1.76 u[IU]/mL (ref 0.450–4.500)

## 2019-08-27 NOTE — Telephone Encounter (Signed)
-----   Message from Virginia Crews, MD sent at 08/27/2019  8:48 AM EST ----- Normal labs

## 2019-08-27 NOTE — Telephone Encounter (Signed)
Comment seen by patient Krista Mosley on 08/27/2019 9:51 AM EST

## 2019-09-02 ENCOUNTER — Other Ambulatory Visit: Payer: Self-pay | Admitting: Family Medicine

## 2019-09-02 ENCOUNTER — Ambulatory Visit
Admission: RE | Admit: 2019-09-02 | Discharge: 2019-09-02 | Disposition: A | Discharge status: Home / Self Care | Payer: BC Managed Care – PPO | Source: Ambulatory Visit | Attending: Family Medicine | Admitting: Family Medicine

## 2019-09-02 DIAGNOSIS — Z1231 Encounter for screening mammogram for malignant neoplasm of breast: Secondary | ICD-10-CM | POA: Diagnosis not present

## 2019-09-02 DIAGNOSIS — N6489 Other specified disorders of breast: Secondary | ICD-10-CM

## 2019-09-02 DIAGNOSIS — R928 Other abnormal and inconclusive findings on diagnostic imaging of breast: Secondary | ICD-10-CM

## 2019-09-09 ENCOUNTER — Encounter: Payer: Self-pay | Admitting: Family Medicine

## 2019-09-10 ENCOUNTER — Other Ambulatory Visit: Payer: Self-pay

## 2019-09-10 DIAGNOSIS — N6489 Other specified disorders of breast: Secondary | ICD-10-CM

## 2019-09-10 DIAGNOSIS — R928 Other abnormal and inconclusive findings on diagnostic imaging of breast: Secondary | ICD-10-CM

## 2019-09-16 ENCOUNTER — Ambulatory Visit
Admission: RE | Admit: 2019-09-16 | Discharge: 2019-09-16 | Disposition: A | Discharge status: Home / Self Care | Payer: BC Managed Care – PPO | Source: Ambulatory Visit | Attending: Family Medicine | Admitting: Family Medicine

## 2019-09-16 DIAGNOSIS — R928 Other abnormal and inconclusive findings on diagnostic imaging of breast: Secondary | ICD-10-CM | POA: Insufficient documentation

## 2019-09-16 DIAGNOSIS — N6489 Other specified disorders of breast: Secondary | ICD-10-CM | POA: Diagnosis not present

## 2019-09-16 DIAGNOSIS — R922 Inconclusive mammogram: Secondary | ICD-10-CM | POA: Diagnosis not present

## 2019-09-29 ENCOUNTER — Ambulatory Visit: Payer: BC Managed Care – PPO | Attending: Internal Medicine

## 2019-09-29 DIAGNOSIS — Z20822 Contact with and (suspected) exposure to covid-19: Secondary | ICD-10-CM | POA: Diagnosis not present

## 2019-09-30 LAB — NOVEL CORONAVIRUS, NAA: SARS-CoV-2, NAA: NOT DETECTED

## 2019-10-09 ENCOUNTER — Ambulatory Visit: Payer: BC Managed Care – PPO | Attending: Internal Medicine

## 2019-10-09 DIAGNOSIS — Z20822 Contact with and (suspected) exposure to covid-19: Secondary | ICD-10-CM | POA: Diagnosis not present

## 2019-10-10 LAB — NOVEL CORONAVIRUS, NAA: SARS-CoV-2, NAA: NOT DETECTED

## 2019-11-20 ENCOUNTER — Ambulatory Visit: Payer: BC Managed Care – PPO | Attending: Internal Medicine

## 2019-11-20 DIAGNOSIS — Z23 Encounter for immunization: Secondary | ICD-10-CM

## 2019-11-20 NOTE — Progress Notes (Signed)
   Covid-19 Vaccination Clinic  Name:  LAYLIA MUI    MRN: [id number] DOB: [birth year]  11/20/2019  Ms. Bazin was observed post Covid-19 immunization for 15 minutes without incident. She was provided with Vaccine Information Sheet and instruction to access the V-Safe system.   Ms. Nichelson was instructed to call 911 with any severe reactions post vaccine: Marland Kitchen Difficulty breathing  . Swelling of face and throat  . A fast heartbeat  . A bad rash all over body  . Dizziness and weakness   Immunizations Administered    Name Date Dose VIS Date Route   Pfizer COVID-19 Vaccine 11/20/2019  8:22 AM 0.3 mL 08/21/2019 Intramuscular   Manufacturer: [employer], [employer]   Lot: [id number]   NDC: [address]-2

## 2019-12-15 ENCOUNTER — Ambulatory Visit: Payer: BC Managed Care – PPO | Attending: Internal Medicine

## 2019-12-15 DIAGNOSIS — Z23 Encounter for immunization: Secondary | ICD-10-CM

## 2019-12-15 NOTE — Progress Notes (Signed)
   Covid-19 Vaccination Clinic  Name:  DORATHEA FAERBER    MRN: [id number] DOB: 1971/03/01  12/15/2019  Ms. Spake was observed post Covid-19 immunization for 15 minutes without incident. She was provided with Vaccine Information Sheet and instruction to access the V-Safe system.   Ms. Mackowiak was instructed to call 911 with any severe reactions post vaccine: Marland Kitchen Difficulty breathing  . Swelling of face and throat  . A fast heartbeat  . A bad rash all over body  . Dizziness and weakness   Immunizations Administered    Name Date Dose VIS Date Route   Pfizer COVID-19 Vaccine 12/15/2019  9:36 AM 0.3 mL 08/21/2019 Intramuscular   Manufacturer: [employer], [employer]   Lot: [id number]   NDC: [address]-5

## 2019-12-21 ENCOUNTER — Ambulatory Visit: Payer: BC Managed Care – PPO

## 2020-04-06 DIAGNOSIS — D2262 Melanocytic nevi of left upper limb, including shoulder: Secondary | ICD-10-CM | POA: Diagnosis not present

## 2020-04-06 DIAGNOSIS — D2261 Melanocytic nevi of right upper limb, including shoulder: Secondary | ICD-10-CM | POA: Diagnosis not present

## 2020-04-06 DIAGNOSIS — D225 Melanocytic nevi of trunk: Secondary | ICD-10-CM | POA: Diagnosis not present

## 2020-04-06 DIAGNOSIS — D2271 Melanocytic nevi of right lower limb, including hip: Secondary | ICD-10-CM | POA: Diagnosis not present

## 2020-08-29 NOTE — Progress Notes (Signed)
Complete physical exam   Patient: Krista Mosley   DOB: September 21, 1970   49 y.o. Female  MRN: [id number] Visit Date: 08/30/2020  Today's healthcare provider: Lavon Paganini, MD   Chief Complaint  Patient presents with  . Annual Exam   Subjective    Krista Mosley is a 49 y.o. female who presents today for a complete physical exam.  She reports consuming a general diet. Home exercise routine includes walking 3 hrs per week. She generally feels well. She reports sleeping well. She does not have additional problems to discuss today.  HPI  05/07/2017 Pap/HPV-negative 09/16/2019 Mammogram-BI-RADS 3  History reviewed. No pertinent past medical history. Past Surgical History:  Procedure Laterality Date  . CESAREAN SECTION    . cryotherapy    . EXTERNAL EAR SURGERY    . KNEE SURGERY    . LEEP    . MENISCUS REPAIR     Social History   Socioeconomic History  . Marital status: Married    Spouse name: Not on file  . Number of children: 2  . Years of education: Not on file  . Highest education level: Not on file  Occupational History  . Not on file  Tobacco Use  . Smoking status: Never Smoker  . Smokeless tobacco: Never Used  Vaping Use  . Vaping Use: Never used  Substance and Sexual Activity  . Alcohol use: Yes    Alcohol/week: 5.0 standard drinks    Types: 5 Glasses of wine per week  . Drug use: No  . Sexual activity: Yes    Birth control/protection: None  Other Topics Concern  . Not on file  Social History Narrative  . Not on file   Social Determinants of Health   Financial Resource Strain: Not on file  Food Insecurity: Not on file  Transportation Needs: Not on file  Physical Activity: Not on file  Stress: Not on file  Social Connections: Not on file  Intimate Partner Violence: Not on file   Family Status  Relation Name Status  . Mother  Alive  . Father  Alive  . Brother  Alive  . Brother  Alive  . Daughter  Alive  . [name]  (Not Specified)    Family History  Problem Relation Age of Onset  . Breast cancer Mother 79       Invasive lobular cancer, T3N2 ER+, PR-, Her2 -, metastatic, recurrence after ~65yr  . Esophageal cancer Mother   . Breast cancer Paternal Aunt 655  Allergies  Allergen Reactions  . Codeine     REACTION: NAUSEA + VOMITING    Patient Care Team: BVirginia Crews MD as PCP - General (Family Medicine)   Medications: Outpatient Medications Prior to Visit  Medication Sig  . Cetirizine HCl (ZYRTEC ALLERGY) 10 MG CAPS Take by mouth.  . metroNIDAZOLE (METROGEL) 1 % gel Apply 1 application topically daily.   No facility-administered medications prior to visit.    Review of Systems  Constitutional: Negative.   HENT: Negative.   Eyes: Negative.   Respiratory: Negative.   Cardiovascular: Negative.   Gastrointestinal: Negative.   Endocrine: Negative.   Genitourinary: Negative.   Musculoskeletal: Negative.   Skin: Negative.   Allergic/Immunologic: Positive for environmental allergies.  Neurological: Negative.   Hematological: Negative.   Psychiatric/Behavioral: Negative.     Last CBC Lab Results  Component Value Date   WBC 4.2 08/26/2019   HGB 14.3 08/26/2019   HCT 41.9 08/26/2019  MCV 92 08/26/2019   MCH 31.5 08/26/2019   RDW 12.4 08/26/2019   PLT 331 44/96/7591   Last metabolic panel Lab Results  Component Value Date   GLUCOSE 84 08/26/2019   NA 139 08/26/2019   K 4.3 08/26/2019   CL 102 08/26/2019   CO2 20 08/26/2019   BUN 17 08/26/2019   CREATININE 0.67 08/26/2019   GFRNONAA 105 08/26/2019   GFRAA 121 08/26/2019   CALCIUM 9.5 08/26/2019   PROT 6.9 08/26/2019   ALBUMIN 4.8 08/26/2019   LABGLOB 2.1 08/26/2019   AGRATIO 2.3 (H) 08/26/2019   BILITOT 0.9 08/26/2019   ALKPHOS 58 08/26/2019   AST 13 08/26/2019   ALT 12 08/26/2019   Last lipids Lab Results  Component Value Date   CHOL 210 (A) 05/28/2019   HDL 90 (A) 05/28/2019   LDLCALC 101 05/28/2019   TRIG 96  05/28/2019   CHOLHDL 2.6 CALC 06/02/2008   Last hemoglobin A1c Lab Results  Component Value Date   HGBA1C 5.0 08/26/2019   Last thyroid functions Lab Results  Component Value Date   TSH 1.760 08/26/2019      Objective    BP 102/76 (BP Location: Right Arm, Patient Position: Sitting, Cuff Size: Normal)   Pulse 67   Temp 98.1 F (36.7 C) (Oral)   Resp 16   Ht 5' 4"  (1.626 m)   Wt 164 lb 1.6 oz (74.4 kg)   LMP 08/23/2020 (Exact Date)   SpO2 99%   BMI 28.17 kg/m  BP Readings from Last 3 Encounters:  08/30/20 102/76  08/26/19 115/75  05/19/18 108/68   Wt Readings from Last 3 Encounters:  08/30/20 164 lb 1.6 oz (74.4 kg)  08/26/19 158 lb (71.7 kg)  05/19/18 157 lb 12.8 oz (71.6 kg)      Physical Exam Vitals reviewed.  Constitutional:      General: She is not in acute distress.    Appearance: Normal appearance. She is well-developed. She is not diaphoretic.  HENT:     Head: Normocephalic and atraumatic.     Right Ear: Tympanic membrane, ear canal and external ear normal.     Left Ear: Tympanic membrane, ear canal and external ear normal.  Eyes:     General: No scleral icterus.    Conjunctiva/sclera: Conjunctivae normal.     Pupils: Pupils are equal, round, and reactive to light.  Neck:     Thyroid: No thyromegaly.  Cardiovascular:     Rate and Rhythm: Normal rate and regular rhythm.     Pulses: Normal pulses.     Heart sounds: Normal heart sounds. No murmur heard.   Pulmonary:     Effort: Pulmonary effort is normal. No respiratory distress.     Breath sounds: Normal breath sounds. No wheezing or rales.  Abdominal:     General: There is no distension.     Palpations: Abdomen is soft.     Tenderness: There is no abdominal tenderness.  Musculoskeletal:        General: No deformity.     Cervical back: Neck supple.     Right lower leg: No edema.     Left lower leg: No edema.  Lymphadenopathy:     Cervical: No cervical adenopathy.  Skin:    General: Skin  is warm and dry.     Findings: No rash.  Neurological:     Mental Status: She is alert and oriented to person, place, and time. Mental status is at baseline.  Sensory: No sensory deficit.     Motor: No weakness.     Gait: Gait normal.  Psychiatric:        Mood and Affect: Mood normal.        Behavior: Behavior normal.        Thought Content: Thought content normal.       Last depression screening scores PHQ 2/9 Scores 08/30/2020 08/26/2019 05/19/2018  PHQ - 2 Score 0 0 0  PHQ- 9 Score 0 0 0   Last fall risk screening Fall Risk  08/30/2020  Falls in the past year? 0  Number falls in past yr: 0  Injury with Fall? 0  Risk for fall due to : No Fall Risks  Follow up Falls evaluation completed   Last Audit-C alcohol use screening Alcohol Use Disorder Test (AUDIT) 08/30/2020  1. How often do you have a drink containing alcohol? 4  2. How many drinks containing alcohol do you have on a typical day when you are drinking? 1  3. How often do you have six or more drinks on one occasion? 1  AUDIT-C Score 6  4. How often during the last year have you found that you were not able to stop drinking once you had started? 0  5. How often during the last year have you failed to do what was normally expected from you because of drinking? 0  6. How often during the last year have you needed a first drink in the morning to get yourself going after a heavy drinking session? 0  7. How often during the last year have you had a feeling of guilt of remorse after drinking? 0  8. How often during the last year have you been unable to remember what happened the night before because you had been drinking? 0  9. Have you or someone else been injured as a result of your drinking? 0  10. Has a relative or friend or a doctor or another health worker been concerned about your drinking or suggested you cut down? 0  Alcohol Use Disorder Identification Test Final Score (AUDIT) 6  Alcohol Brief  Interventions/Follow-up AUDIT Score <7 follow-up not indicated   A score of 3 or more in women, and 4 or more in men indicates increased risk for alcohol abuse, EXCEPT if all of the points are from question 1   No results found for any visits on 08/30/20.  Assessment & Plan    Routine Health Maintenance and Physical Exam  Exercise Activities and Dietary recommendations Goals   None     Immunization History  Administered Date(s) Administered  . Influenza Split 06/07/2009, 05/27/2010, 06/09/2011, 07/15/2012  . Influenza Whole 08/06/2005  . Influenza,inj,Quad PF,6+ Mos 07/18/2013, 07/15/2014, 07/23/2015, 05/19/2018  . Influenza-Unspecified 06/12/2017, 06/19/2019, 06/14/2020  . PFIZER SARS-COV-2 Vaccination 11/20/2019, 12/15/2019, 07/14/2020  . Td 08/06/2005, 06/16/2011, 01/10/2016  . Tdap 06/16/2011, 01/10/2016    Health Maintenance  Topic Date Due  . Hepatitis C Screening  Never done  . PAP SMEAR-Modifier  05/07/2022  . TETANUS/TDAP  01/09/2026  . INFLUENZA VACCINE  Completed  . COVID-19 Vaccine  Completed  . HIV Screening  Completed    Discussed health benefits of physical activity, and encouraged her to engage in regular exercise appropriate for her age and condition.  Problem List Items Addressed This Visit      Endocrine   Subclinical hypothyroidism   Relevant Orders   TSH     Other   Hypercholesteremia   Relevant  Orders   Comprehensive metabolic panel   Lipid Panel With LDL/HDL Ratio   History of anemia   Relevant Orders   CBC with Differential/Platelet   Blood glucose elevated   Relevant Orders   Hemoglobin A1c   Annual physical exam - Primary   Relevant Orders   CBC with Differential/Platelet   Comprehensive metabolic panel   Hemoglobin A1c   Hepatitis C antibody   Lipid Panel With LDL/HDL Ratio   TSH   Overweight   Relevant Orders   Comprehensive metabolic panel   Hemoglobin A1c   Lipid Panel With LDL/HDL Ratio    Other Visit Diagnoses     Need for hepatitis C screening test       Relevant Orders   Hepatitis C antibody   Encounter for screening mammogram for malignant neoplasm of breast       History of abnormal mammogram       Screening for colon cancer       Relevant Orders   Ambulatory referral to Gastroenterology       Return in about 1 year (around 08/30/2021) for CPE.     I, Lavon Paganini, MD, have reviewed all documentation for this visit. The documentation on 08/30/20 for the exam, diagnosis, procedures, and orders are all accurate and complete.   Jamin Panther, Dionne Bucy, MD, MPH [facility]

## 2020-08-29 NOTE — Patient Instructions (Signed)

## 2020-08-30 ENCOUNTER — Ambulatory Visit (INDEPENDENT_AMBULATORY_CARE_PROVIDER_SITE_OTHER): Payer: BC Managed Care – PPO | Admitting: Family Medicine

## 2020-08-30 ENCOUNTER — Other Ambulatory Visit: Payer: Self-pay | Admitting: Family Medicine

## 2020-08-30 ENCOUNTER — Encounter: Payer: Self-pay | Admitting: Family Medicine

## 2020-08-30 ENCOUNTER — Other Ambulatory Visit: Payer: Self-pay

## 2020-08-30 VITALS — BP 102/76 | HR 67 | Temp 98.1°F | Resp 16 | Ht 64.0 in | Wt 164.1 lb

## 2020-08-30 DIAGNOSIS — E038 Other specified hypothyroidism: Secondary | ICD-10-CM

## 2020-08-30 DIAGNOSIS — R739 Hyperglycemia, unspecified: Secondary | ICD-10-CM | POA: Diagnosis not present

## 2020-08-30 DIAGNOSIS — Z1159 Encounter for screening for other viral diseases: Secondary | ICD-10-CM | POA: Diagnosis not present

## 2020-08-30 DIAGNOSIS — Z862 Personal history of diseases of the blood and blood-forming organs and certain disorders involving the immune mechanism: Secondary | ICD-10-CM

## 2020-08-30 DIAGNOSIS — N6489 Other specified disorders of breast: Secondary | ICD-10-CM

## 2020-08-30 DIAGNOSIS — E7800 Pure hypercholesterolemia, unspecified: Secondary | ICD-10-CM

## 2020-08-30 DIAGNOSIS — Z1231 Encounter for screening mammogram for malignant neoplasm of breast: Secondary | ICD-10-CM

## 2020-08-30 DIAGNOSIS — Z1211 Encounter for screening for malignant neoplasm of colon: Secondary | ICD-10-CM

## 2020-08-30 DIAGNOSIS — Z0000 Encounter for general adult medical examination without abnormal findings: Secondary | ICD-10-CM

## 2020-08-30 DIAGNOSIS — E663 Overweight: Secondary | ICD-10-CM

## 2020-08-30 DIAGNOSIS — Z87898 Personal history of other specified conditions: Secondary | ICD-10-CM

## 2020-08-31 ENCOUNTER — Other Ambulatory Visit: Payer: Self-pay | Admitting: Gastroenterology

## 2020-08-31 ENCOUNTER — Telehealth (INDEPENDENT_AMBULATORY_CARE_PROVIDER_SITE_OTHER): Payer: Self-pay | Admitting: Gastroenterology

## 2020-08-31 DIAGNOSIS — Z1211 Encounter for screening for malignant neoplasm of colon: Secondary | ICD-10-CM

## 2020-08-31 LAB — COMPREHENSIVE METABOLIC PANEL
ALT: 14 IU/L (ref 0–32)
AST: 15 IU/L (ref 0–40)
Albumin/Globulin Ratio: 2.8 — ABNORMAL HIGH (ref 1.2–2.2)
Albumin: 4.7 g/dL (ref 3.8–4.8)
Alkaline Phosphatase: 60 IU/L (ref 44–121)
BUN/Creatinine Ratio: 16 (ref 9–23)
BUN: 13 mg/dL (ref 6–24)
Bilirubin Total: 0.9 mg/dL (ref 0.0–1.2)
CO2: 22 mmol/L (ref 20–29)
Calcium: 9.1 mg/dL (ref 8.7–10.2)
Chloride: 102 mmol/L (ref 96–106)
Creatinine, Ser: 0.79 mg/dL (ref 0.57–1.00)
GFR calc Af Amer: 102 mL/min/{1.73_m2} (ref >59)
GFR calc non Af Amer: 89 mL/min/{1.73_m2} (ref >59)
Globulin, Total: 1.7 g/dL (ref 1.5–4.5)
Glucose: 90 mg/dL (ref 65–99)
Potassium: 4.3 mmol/L (ref 3.5–5.2)
Sodium: 137 mmol/L (ref 134–144)
Total Protein: 6.4 g/dL (ref 6.0–8.5)

## 2020-08-31 LAB — CBC WITH DIFFERENTIAL/PLATELET
Basophils Absolute: 0 10*3/uL (ref 0.0–0.2)
Basos: 1 %
EOS (ABSOLUTE): 0.1 10*3/uL (ref 0.0–0.4)
Eos: 3 %
Hematocrit: 40.3 % (ref 34.0–46.6)
Hemoglobin: 14.1 g/dL (ref 11.1–15.9)
Immature Grans (Abs): 0 10*3/uL (ref 0.0–0.1)
Immature Granulocytes: 1 %
Lymphocytes Absolute: 1.3 10*3/uL (ref 0.7–3.1)
Lymphs: 38 %
MCH: 32.3 pg (ref 26.6–33.0)
MCHC: 35 g/dL (ref 31.5–35.7)
MCV: 92 fL (ref 79–97)
Monocytes Absolute: 0.4 10*3/uL (ref 0.1–0.9)
Monocytes: 10 %
Neutrophils Absolute: 1.7 10*3/uL (ref 1.4–7.0)
Neutrophils: 47 %
Platelets: 312 10*3/uL (ref 150–450)
RBC: 4.36 x10E6/uL (ref 3.77–5.28)
RDW: 12.8 % (ref 11.7–15.4)
WBC: 3.5 10*3/uL (ref 3.4–10.8)

## 2020-08-31 LAB — LIPID PANEL WITH LDL/HDL RATIO
Cholesterol, Total: 215 mg/dL — ABNORMAL HIGH (ref 100–199)
HDL: 71 mg/dL (ref >39)
LDL Chol Calc (NIH): 125 mg/dL — ABNORMAL HIGH (ref 0–99)
LDL/HDL Ratio: 1.8 ratio (ref 0.0–3.2)
Triglycerides: 109 mg/dL (ref 0–149)
VLDL Cholesterol Cal: 19 mg/dL (ref 5–40)

## 2020-08-31 LAB — HEMOGLOBIN A1C
Est. average glucose Bld gHb Est-mCnc: 105 mg/dL
Hgb A1c MFr Bld: 5.3 % (ref 4.8–5.6)

## 2020-08-31 LAB — HEPATITIS C ANTIBODY: Hep C Virus Ab: 0.1 s/co ratio (ref 0.0–0.9)

## 2020-08-31 LAB — TSH: TSH: 2.71 u[IU]/mL (ref 0.450–4.500)

## 2020-08-31 MED ORDER — NA SULFATE-K SULFATE-MG SULF 17.5-3.13-1.6 GM/177ML PO SOLN: 1.0000 | Freq: Once | ORAL | 0 refills | Status: AC

## 2020-08-31 NOTE — Progress Notes (Signed)
Gastroenterology Pre-Procedure Review  Request Date: Friday 09/30/20 Requesting Physician: Dr. Tobi Bastos  PATIENT REVIEW QUESTIONS: The patient responded to the following health history questions as indicated:    1. Are you having any GI issues? no 2. Do you have a personal history of Polyps? no 3. Do you have a family history of Colon Cancer or Polyps? unsure of family history 4. Diabetes Mellitus? no 5. Joint replacements in the past 12 months?no 6. Major health problems in the past 3 months?no 7. Any artificial heart valves, MVP, or defibrillator?no    MEDICATIONS & ALLERGIES:    Patient reports the following regarding taking any anticoagulation/antiplatelet therapy:   Plavix, Coumadin, Eliquis, Xarelto, Lovenox, Pradaxa, Brilinta, or Effient? no Aspirin? no  Patient confirms/reports the following medications:  Current Outpatient Medications  Medication Sig Dispense Refill   Cetirizine HCl 10 MG CAPS Take by mouth.     metroNIDAZOLE (METROGEL) 1 % gel Apply 1 application topically daily.     No current facility-administered medications for this visit.    Patient confirms/reports the following allergies:  Allergies  Allergen Reactions   Codeine     REACTION: NAUSEA + VOMITING    Orders Placed This Encounter  Procedures   Procedural/ Surgical Case Request: COLONOSCOPY WITH PROPOFOL    Standing Status:   Standing    Number of Occurrences:   1    Order Specific Question:   Pre-op diagnosis    Answer:   screening colonoscopy    Order Specific Question:   CPT Code    Answer:   30076    AUTHORIZATION INFORMATION Primary Insurance: 1D#: Group #:  Secondary Insurance: 1D#: Group #:  SCHEDULE INFORMATION: Date: 09/30/20 Time: Location:[facility]

## 2020-09-27 ENCOUNTER — Ambulatory Visit
Admission: RE | Admit: 2020-09-27 | Discharge: 2020-09-27 | Disposition: A | Discharge status: Home / Self Care | Payer: BC Managed Care – PPO | Source: Ambulatory Visit | Attending: Family Medicine | Admitting: Family Medicine

## 2020-09-27 ENCOUNTER — Other Ambulatory Visit: Payer: Self-pay

## 2020-09-27 ENCOUNTER — Other Ambulatory Visit: Payer: BC Managed Care – PPO

## 2020-09-27 DIAGNOSIS — N6489 Other specified disorders of breast: Secondary | ICD-10-CM | POA: Diagnosis not present

## 2020-09-27 DIAGNOSIS — R922 Inconclusive mammogram: Secondary | ICD-10-CM | POA: Diagnosis not present

## 2020-09-28 ENCOUNTER — Other Ambulatory Visit
Admission: RE | Admit: 2020-09-28 | Discharge: 2020-09-28 | Disposition: A | Discharge status: Home / Self Care | Payer: BC Managed Care – PPO | Source: Ambulatory Visit | Attending: Gastroenterology | Admitting: Gastroenterology

## 2020-09-28 ENCOUNTER — Telehealth: Payer: Self-pay

## 2020-09-28 DIAGNOSIS — Z885 Allergy status to narcotic agent status: Secondary | ICD-10-CM | POA: Diagnosis not present

## 2020-09-28 DIAGNOSIS — Z803 Family history of malignant neoplasm of breast: Secondary | ICD-10-CM | POA: Diagnosis not present

## 2020-09-28 DIAGNOSIS — Z01812 Encounter for preprocedural laboratory examination: Secondary | ICD-10-CM | POA: Insufficient documentation

## 2020-09-28 DIAGNOSIS — Z1211 Encounter for screening for malignant neoplasm of colon: Secondary | ICD-10-CM | POA: Diagnosis not present

## 2020-09-28 DIAGNOSIS — Z20822 Contact with and (suspected) exposure to covid-19: Secondary | ICD-10-CM | POA: Insufficient documentation

## 2020-09-28 LAB — SARS CORONAVIRUS 2 (TAT 6-24 HRS): SARS Coronavirus 2: NEGATIVE

## 2020-09-28 NOTE — Telephone Encounter (Signed)
Patients call has been returned.  She states she forgot to stop her iron supplements 5 days prior to her colonoscopy her colonoscopy is on Friday.  Her last dose was last night.  I advised her that she would be fine to make sure she stops it now.  Thanks,  [place], [place]

## 2020-09-30 ENCOUNTER — Encounter: Payer: Self-pay | Admitting: Gastroenterology

## 2020-09-30 ENCOUNTER — Encounter
Admission: RE | Disposition: A | Discharge status: Home / Self Care | Payer: Self-pay | Source: Home / Self Care | Attending: Gastroenterology

## 2020-09-30 ENCOUNTER — Ambulatory Visit: Payer: BC Managed Care – PPO | Admitting: Certified Registered Nurse Anesthetist

## 2020-09-30 ENCOUNTER — Ambulatory Visit
Admission: RE | Admit: 2020-09-30 | Discharge: 2020-09-30 | Disposition: A | Discharge status: Home / Self Care | Payer: BC Managed Care – PPO | Attending: Gastroenterology | Admitting: Gastroenterology

## 2020-09-30 DIAGNOSIS — Z20822 Contact with and (suspected) exposure to covid-19: Secondary | ICD-10-CM | POA: Insufficient documentation

## 2020-09-30 DIAGNOSIS — Z1211 Encounter for screening for malignant neoplasm of colon: Secondary | ICD-10-CM | POA: Diagnosis not present

## 2020-09-30 DIAGNOSIS — Z803 Family history of malignant neoplasm of breast: Secondary | ICD-10-CM | POA: Diagnosis not present

## 2020-09-30 DIAGNOSIS — Z885 Allergy status to narcotic agent status: Secondary | ICD-10-CM | POA: Insufficient documentation

## 2020-09-30 HISTORY — DX: Other specified postprocedural states: Z98.890

## 2020-09-30 HISTORY — PX: COLONOSCOPY WITH PROPOFOL: SHX5780

## 2020-09-30 SURGERY — COLONOSCOPY WITH PROPOFOL
Anesthesia: General

## 2020-09-30 MED ORDER — LIDOCAINE HCL (CARDIAC) PF 100 MG/5ML IV SOSY
PREFILLED_SYRINGE | INTRAVENOUS | Status: DC | PRN
  Administered 2020-09-30: 50 mg via INTRAVENOUS

## 2020-09-30 MED ORDER — PROPOFOL 10 MG/ML IV BOLUS
INTRAVENOUS | Status: DC | PRN
  Administered 2020-09-30 (×3): 20 mg via INTRAVENOUS
  Administered 2020-09-30: 60 mg via INTRAVENOUS

## 2020-09-30 MED ORDER — SODIUM CHLORIDE 0.9 % IV SOLN
INTRAVENOUS | Status: DC
  Administered 2020-09-30: 1000 mL via INTRAVENOUS

## 2020-09-30 MED ORDER — PROPOFOL 500 MG/50ML IV EMUL
INTRAVENOUS | Status: DC | PRN
  Administered 2020-09-30: 160 ug/kg/min via INTRAVENOUS

## 2020-09-30 NOTE — Op Note (Signed)
[facility] Gastroenterology Patient Name: Krista Mosley Procedure Date: 09/30/2020 10:10 AM MRN: [id number] Account #: [id number] Date of Birth: Jul 05, 1971 Admit Type: Outpatient Age: 50 Room: [facility] ENDO ROOM 4 Gender: Female Note Status: Finalized Procedure:             Colonoscopy Indications:           Screening for colorectal malignant neoplasm Providers:             Wyline Mood MD, MD Referring MD:          Marzella Schlein. Bacigalupo (Referring MD) Medicines:             Monitored Anesthesia Care Complications:         No immediate complications. Procedure:             Pre-Anesthesia Assessment:                        - Prior to the procedure, a History and Physical was                         performed, and patient medications, allergies and                         sensitivities were reviewed. The patient's tolerance                         of previous anesthesia was reviewed.                        - The risks and benefits of the procedure and the                         sedation options and risks were discussed with the                         patient. All questions were answered and informed                         consent was obtained.                        - ASA Grade Assessment: II - A patient with mild                         systemic disease.                        After obtaining informed consent, the colonoscope was                         passed under direct vision. Throughout the procedure,                         the patient's blood pressure, pulse, and oxygen                         saturations were monitored continuously. The                         Colonoscope was introduced through the anus and  advanced to the the cecum, identified by the                         appendiceal orifice. The colonoscopy was performed                         with ease. The patient tolerated the procedure well.                         The quality  of the bowel preparation was excellent. Findings:      The perianal and digital rectal examinations were normal.      The entire examined colon appeared normal on direct and retroflexion       views. Impression:            - The entire examined colon is normal on direct and                         retroflexion views.                        - No specimens collected. Recommendation:        - Discharge patient to home (with escort).                        - Resume previous diet.                        - Continue present medications.                        - Repeat colonoscopy in 10 years for screening                         purposes. Procedure Code(s):     --- Professional ---                        [phone], Colonoscopy, flexible; diagnostic, including                         collection of specimen(s) by brushing or washing, when                         performed (separate procedure) Diagnosis Code(s):     --- Professional ---                        Z12.11, Encounter for screening for malignant neoplasm                         of colon CPT copyright 2019 American Medical Association. All rights reserved. The codes documented in this report are preliminary and upon coder review may  be revised to meet current compliance requirements. Wyline Mood, MD Wyline Mood MD, MD 09/30/2020 10:35:00 AM This report has been signed electronically. Number of Addenda: 0 Note Initiated On: 09/30/2020 10:10 AM Scope Withdrawal Time: 0 hours 9 minutes 32 seconds  Total Procedure Duration: 0 hours 15 minutes 30 seconds  Estimated Blood Loss:  Estimated blood loss: none.      [facility]

## 2020-09-30 NOTE — Anesthesia Postprocedure Evaluation (Signed)
Anesthesia Post Note  Patient: Krista Mosley  Procedure(s) Performed: COLONOSCOPY WITH PROPOFOL (N/A )  Patient location during evaluation: Endoscopy Anesthesia Type: General Level of consciousness: awake and alert Pain management: pain level controlled Vital Signs Assessment: post-procedure vital signs reviewed and stable Respiratory status: spontaneous breathing, nonlabored ventilation, respiratory function stable and patient connected to nasal cannula oxygen Cardiovascular status: blood pressure returned to baseline and stable Postop Assessment: no apparent nausea or vomiting Anesthetic complications: no   No complications documented.   Last Vitals:  Vitals:   09/30/20 1046 09/30/20 1056  BP: 96/69 114/85  Pulse: 71 80  Resp: 15 17  Temp:    SpO2: 99% 94%    Last Pain:  Vitals:   09/30/20 1056  TempSrc:   PainSc: 0-No pain                 Cleda Mccreedy Perina Salvaggio

## 2020-09-30 NOTE — H&P (Signed)
Jonathon Bellows, MD [address], [place], [place], [place], [address] [address], [place], [place], [place], [address] Phone: [phone]  Fax: [phone]  Primary Care Physician:  Virginia Crews, MD   Pre-Procedure History & Physical: HPI:  Krista Mosley is a 50 y.o. female is here for an colonoscopy.   Past Medical History:  Diagnosis Date  . H/O knee surgery     Past Surgical History:  Procedure Laterality Date  . CESAREAN SECTION    . cryotherapy    . EXTERNAL EAR SURGERY    . KNEE SURGERY    . LEEP    . MENISCUS REPAIR      Prior to Admission medications   Medication Sig Start Date End Date Taking? Authorizing Provider  Cetirizine HCl 10 MG CAPS Take by mouth.   Yes [provider]  metroNIDAZOLE (METROGEL) 1 % gel Apply 1 application topically daily.   Yes [provider]    Allergies as of 08/30/2020 - Review Complete 08/30/2020  Allergen Reaction Noted  . Codeine  05/20/2007    Family History  Problem Relation Age of Onset  . Breast cancer Mother 12       Invasive lobular cancer, T3N2 ER+, PR-, Her2 -, metastatic, recurrence after ~47yr  . Esophageal cancer Mother   . Breast cancer Paternal AAunt 69   Social History   Socioeconomic History  . Marital status: Married    Spouse name: Not on file  . Number of children: 2  . Years of education: Not on file  . Highest education level: Not on file  Occupational History  . Not on file  Tobacco Use  . Smoking status: Never Smoker  . Smokeless tobacco: Never Used  Vaping Use  . Vaping Use: Never used  Substance and Sexual Activity  . Alcohol use: Yes    Alcohol/week: 5.0 standard drinks    Types: 5 Glasses of wine per week  . Drug use: No  . Sexual activity: Yes    Birth control/protection: None  Other Topics Concern  . Not on file  Social History Narrative  . Not on file   Social Determinants of Health   Financial Resource Strain: Not on file  Food  Insecurity: Not on file  Transportation Needs: Not on file  Physical Activity: Not on file  Stress: Not on file  Social Connections: Not on file  Intimate Partner Violence: Not on file    Review of Systems: See HPI, otherwise negative ROS  Physical Exam: BP 133/90   Pulse 79   Temp (!) 97.3 F (36.3 C) (Temporal)   Resp 18   Ht _0  (1.626 m)   Wt 72.7 kg   LMP 09/19/2020 Comment: pregnancy test negative  SpO2 99%   BMI 27.52 kg/m  General:   Alert,  pleasant and cooperative in NAD Head:  Normocephalic and atraumatic. Neck:  Supple; no masses or thyromegaly. Lungs:  Clear throughout to auscultation, normal respiratory effort.    Heart:  +S1, +S2, Regular rate and rhythm, No edema. Abdomen:  Soft, nontender and nondistended. Normal bowel sounds, without guarding, and without rebound.   Neurologic:  Alert and  oriented x4;  grossly normal neurologically.  Impression/Plan: [name] here for an colonoscopy to be performed for Screening colonoscopy average risk   Risks, benefits, limitations, and alternatives regarding  colonoscopy have been reviewed with the patient.  Questions have been answered.  All parties agreeable.  Jonathon Bellows, MD  09/30/2020, 10:12 AM

## 2020-09-30 NOTE — Transfer of Care (Signed)
Immediate Anesthesia Transfer of Care Note  Patient: Krista Mosley  Procedure(s) Performed: COLONOSCOPY WITH PROPOFOL (N/A )  Patient Location: Endoscopy Unit  Anesthesia Type:General  Level of Consciousness: awake, alert  and oriented  Airway & Oxygen Therapy: Patient Spontanous Breathing and Patient connected to nasal cannula oxygen  Post-op Assessment: Report given to RN and Post -op Vital signs reviewed and stable  Post vital signs: Reviewed and stable  Last Vitals:  Vitals Value Taken Time  BP 105/57 09/30/20 1037  Temp    Pulse 84 09/30/20 1037  Resp 23 09/30/20 1037  SpO2 98 % 09/30/20 1037  Vitals shown include unvalidated device data.  Last Pain:  Vitals:   09/30/20 0932  TempSrc: Temporal  PainSc: 0-No pain         Complications: No complications documented.

## 2020-09-30 NOTE — Anesthesia Preprocedure Evaluation (Signed)
Anesthesia Evaluation  Patient identified by MRN, date of birth, ID band Patient awake    Reviewed: Allergy & Precautions, H&P , NPO status , Patient's Chart, lab work & pertinent test results  History of Anesthesia Complications (+) PONV and history of anesthetic complications  Airway Mallampati: II  TM Distance: >3 FB Neck ROM: full    Dental  (+) Chipped   Pulmonary neg pulmonary ROS, neg shortness of breath,    Pulmonary exam normal        Cardiovascular Exercise Tolerance: Good (-) angina(-) Past MI and (-) DOE negative cardio ROS Normal cardiovascular exam     Neuro/Psych  Headaches, negative psych ROS   GI/Hepatic negative GI ROS, Neg liver ROS, neg GERD  ,  Endo/Other  Hypothyroidism   Renal/GU negative Renal ROS  negative genitourinary   Musculoskeletal   Abdominal   Peds  Hematology negative hematology ROS (+)   Anesthesia Other Findings Past Medical History: No date: H/O knee surgery  Past Surgical History: No date: CESAREAN SECTION No date: cryotherapy No date: EXTERNAL EAR SURGERY No date: KNEE SURGERY No date: LEEP No date: MENISCUS REPAIR  BMI    Body Mass Index: 27.52 kg/m      Reproductive/Obstetrics negative OB ROS                             Anesthesia Physical Anesthesia Plan  ASA: II  Anesthesia Plan: General   Post-op Pain Management:    Induction: Intravenous  PONV Risk Score and Plan: Propofol infusion and TIVA  Airway Management Planned: Natural Airway and Nasal Cannula  Additional Equipment:   Intra-op Plan:   Post-operative Plan:   Informed Consent: I have reviewed the patients History and Physical, chart, labs and discussed the procedure including the risks, benefits and alternatives for the proposed anesthesia with the patient or authorized representative who has indicated his/her understanding and acceptance.     Dental Advisory  Given  Plan Discussed with: Anesthesiologist, CRNA and Surgeon  Anesthesia Plan Comments: (Patient consented for risks of anesthesia including but not limited to:  - adverse reactions to medications - risk of airway placement if required - damage to eyes, teeth, lips or other oral mucosa - nerve damage due to positioning  - sore throat or hoarseness - Damage to heart, brain, nerves, lungs, other parts of body or loss of life  Patient voiced understanding.)        Anesthesia Quick Evaluation

## 2020-10-03 ENCOUNTER — Encounter: Payer: Self-pay | Admitting: Gastroenterology

## 2021-04-06 DIAGNOSIS — D2261 Melanocytic nevi of right upper limb, including shoulder: Secondary | ICD-10-CM | POA: Diagnosis not present

## 2021-04-06 DIAGNOSIS — D2272 Melanocytic nevi of left lower limb, including hip: Secondary | ICD-10-CM | POA: Diagnosis not present

## 2021-04-06 DIAGNOSIS — D2262 Melanocytic nevi of left upper limb, including shoulder: Secondary | ICD-10-CM | POA: Diagnosis not present

## 2021-04-06 DIAGNOSIS — D225 Melanocytic nevi of trunk: Secondary | ICD-10-CM | POA: Diagnosis not present

## 2021-07-05 ENCOUNTER — Encounter: Payer: Self-pay | Admitting: Family Medicine

## 2021-07-20 ENCOUNTER — Ambulatory Visit: Payer: BC Managed Care – PPO | Admitting: Family Medicine

## 2021-07-20 ENCOUNTER — Other Ambulatory Visit: Payer: Self-pay

## 2021-07-20 ENCOUNTER — Encounter: Payer: Self-pay | Admitting: Family Medicine

## 2021-07-20 VITALS — BP 115/79 | HR 82 | Resp 16 | Wt 166.6 lb

## 2021-07-20 DIAGNOSIS — N951 Menopausal and female climacteric states: Secondary | ICD-10-CM

## 2021-07-20 DIAGNOSIS — E663 Overweight: Secondary | ICD-10-CM

## 2021-07-20 DIAGNOSIS — N926 Irregular menstruation, unspecified: Secondary | ICD-10-CM

## 2021-07-20 MED ORDER — NORETHINDRONE ACET-ETHINYL EST 1-20 MG-MCG PO TABS: 1.0000 | ORAL_TABLET | Freq: Every day | ORAL | 4 refills | Status: DC

## 2021-07-20 MED ORDER — LEVONORGEST-ETH ESTRAD 91-DAY 0.15-0.03 MG PO TABS: 1.0000 | ORAL_TABLET | Freq: Every day | ORAL | 4 refills | Status: DC

## 2021-07-20 NOTE — Assessment & Plan Note (Signed)
BMI 28.60 Discussed importance of healthy weight management Discussed diet and exercise

## 2021-07-20 NOTE — Progress Notes (Signed)
Established patient visit   Patient: Krista Mosley   DOB: [birth year]   50 y.o. Female  MRN: [id number] Visit Date: 07/20/2021  Today's healthcare provider: Jacky Kindle, FNP   Chief Complaint  Patient presents with   Contraception    Patient presents in office today to discuss oral contraception to stop her menstrual cycle.    Subjective    HPI HPI     Contraception    Additional comments: Patient presents in office today to discuss oral contraception to stop her menstrual cycle.       Last edited by Fonda Kinder, CMA on 07/20/2021  4:01 PM.        Medications: Outpatient Medications Prior to Visit  Medication Sig   Cetirizine HCl 10 MG CAPS Take by mouth.   [DISCONTINUED] metroNIDAZOLE (METROGEL) 1 % gel Apply 1 application topically daily. (Patient not taking: Reported on 07/20/2021)   No facility-administered medications prior to visit.    Review of Systems     Objective    BP 115/79   Pulse 82   Resp 16   Wt 166 lb 9.6 oz (75.6 kg)   LMP 07/09/2021 (Exact Date)   SpO2 99%   BMI 28.60 kg/m    Physical Exam Vitals and nursing note reviewed.  Constitutional:      General: She is not in acute distress.    Appearance: Normal appearance. She is overweight. She is not ill-appearing, toxic-appearing or diaphoretic.  HENT:     Head: Normocephalic and atraumatic.  Cardiovascular:     Rate and Rhythm: Normal rate and regular rhythm.     Pulses: Normal pulses.     Heart sounds: No murmur heard.   No friction rub. No gallop.  Pulmonary:     Effort: Pulmonary effort is normal. No respiratory distress.     Breath sounds: No stridor. No wheezing, rhonchi or rales.  Chest:     Chest wall: No tenderness.  Abdominal:     General: Bowel sounds are normal.     Palpations: Abdomen is soft.  Musculoskeletal:        General: No swelling, tenderness, deformity or signs of injury. Normal range of motion.     Right lower leg: No edema.     Left  lower leg: No edema.  Skin:    General: Skin is warm and dry.     Capillary Refill: Capillary refill takes less than 2 seconds.     Coloration: Skin is not jaundiced or pale.     Findings: No bruising, erythema, lesion or rash.  Neurological:     General: No focal deficit present.     Mental Status: She is alert and oriented to person, place, and time. Mental status is at baseline.     Cranial Nerves: No cranial nerve deficit.     Sensory: No sensory deficit.     Motor: No weakness.     Coordination: Coordination normal.  Psychiatric:        Mood and Affect: Mood normal.        Behavior: Behavior normal.        Thought Content: Thought content normal.        Judgment: Judgment normal.     No results found for any visits on 07/20/21.  Assessment & Plan     Problem List Items Addressed This Visit       Other   Irregular menses - Primary    Waxes/wanes- wants  to skip cycles given irregularity       Relevant Medications   norethindrone-ethinyl estradiol (MICROGESTIN) 1-20 MG-MCG tablet   Peri-menopausal    Irregular cycles given peri-menopausal      Relevant Medications   norethindrone-ethinyl estradiol (MICROGESTIN) 1-20 MG-MCG tablet   Overweight (BMI 25.0-29.9)    BMI 28.60 Discussed importance of healthy weight management Discussed diet and exercise         Return for annual examination.     Leilani Merl, FNP, have reviewed all documentation for this visit. The documentation on 07/20/21 for the exam, diagnosis, procedures, and orders are all accurate and complete.    Jacky Kindle, FNP  [facility] [phone] (phone) [phone] (fax)  [facility]

## 2021-07-20 NOTE — Assessment & Plan Note (Signed)
Irregular cycles given peri-menopausal

## 2021-07-20 NOTE — Assessment & Plan Note (Signed)
Waxes/wanes- wants to skip cycles given irregularity

## 2021-08-30 NOTE — Progress Notes (Signed)
Complete physical exam   Patient: Krista Mosley   DOB: 1971/06/09   50 y.o. Female  MRN: [id number] Visit Date: 08/31/2021  Today's healthcare provider: Lavon Paganini, MD   Chief Complaint  Patient presents with   Annual Exam   Subjective    Krista Mosley is a 50 y.o. female who presents today for a complete physical exam.  She reports consuming a general diet. Home exercise routine includes walking daily for 40 minutes. She generally feels fairly well. She reports sleeping fairly well. She does not have additional problems to discuss today.  HPI    Past Medical History:  Diagnosis Date   H/O knee surgery    Past Surgical History:  Procedure Laterality Date   CESAREAN SECTION     COLONOSCOPY WITH PROPOFOL N/A 09/30/2020   Procedure: COLONOSCOPY WITH PROPOFOL;  Surgeon: Jonathon Bellows, MD;  Location: [facility] ENDOSCOPY;  Service: Gastroenterology;  Laterality: N/A;   cryotherapy     EXTERNAL EAR SURGERY     KNEE SURGERY     LEEP     MENISCUS REPAIR     Social History   Socioeconomic History   Marital status: Married    Spouse name: Not on file   Number of children: 2   Years of education: Not on file   Highest education level: Not on file  Occupational History   Not on file  Tobacco Use   Smoking status: Never   Smokeless tobacco: Never  Vaping Use   Vaping Use: Never used  Substance and Sexual Activity   Alcohol use: Yes    Alcohol/week: 5.0 standard drinks    Types: 5 Glasses of wine per week   Drug use: No   Sexual activity: Yes    Birth control/protection: None  Other Topics Concern   Not on file  Social History Narrative   Not on file   Social Determinants of Health   Financial Resource Strain: Not on file  Food Insecurity: Not on file  Transportation Needs: Not on file  Physical Activity: Not on file  Stress: Not on file  Social Connections: Not on file  Intimate Partner Violence: Not on file   Family Status  Relation Name Status    Mother  Alive   Father  Alive   Brother  Alive   Brother  Alive   Daughter  [place]  (Not Specified)   Family History  Problem Relation Age of Onset   Breast cancer Mother 63       Invasive lobular cancer, T3N2 ER+, PR-, Her2 -, metastatic, recurrence after ~23yr   Esophageal cancer Mother    Breast cancer Paternal Aunt 665  Allergies  Allergen Reactions   Codeine     REACTION: NAUSEA + VOMITING    Patient Care Team: BVirginia Crews MD as PCP - General (Family Medicine)   Medications: Outpatient Medications Prior to Visit  Medication Sig   Cetirizine HCl 10 MG CAPS Take by mouth.   levonorgestrel-ethinyl estradiol (SEASONALE) 0.15-0.03 MG tablet Take 1 tablet by mouth daily.   [DISCONTINUED] norethindrone-ethinyl estradiol (MICROGESTIN) 1-20 MG-MCG tablet Take 1 tablet by mouth daily.   No facility-administered medications prior to visit.    Review of Systems  Constitutional:  Negative for chills, fatigue and fever.  HENT:  Negative for congestion, ear pain, rhinorrhea, sneezing and sore throat.   Eyes: Negative.  Negative for pain and redness.  Respiratory:  Negative for cough, shortness  of breath and wheezing.   Cardiovascular:  Negative for chest pain and leg swelling.  Gastrointestinal:  Negative for abdominal pain, blood in stool, constipation, diarrhea and nausea.  Endocrine: Negative for polydipsia and polyphagia.  Genitourinary: Negative.  Negative for dysuria, flank pain, hematuria, pelvic pain, vaginal bleeding and vaginal discharge.  Musculoskeletal:  Negative for arthralgias, back pain, gait problem and joint swelling.  Skin:  Negative for rash.  Allergic/Immunologic: Positive for environmental allergies.  Neurological: Negative.  Negative for dizziness, tremors, seizures, weakness, light-headedness, numbness and headaches.  Hematological:  Negative for adenopathy.  Psychiatric/Behavioral: Negative.  Negative for behavioral problems, confusion  and dysphoric mood. The patient is not nervous/anxious and is not hyperactive.      Objective    BP 120/81 (BP Location: Left Arm, Patient Position: Sitting, Cuff Size: Normal)    Pulse 83    Temp 98.8 F (37.1 C) (Oral)    Resp 16    Ht $R'5\' 4"'CY$  (1.626 m)    Wt 167 lb (75.8 kg)    LMP 08/14/2021    SpO2 99% Comment: room air   BMI 28.67 kg/m    Physical Exam Vitals reviewed.  Constitutional:      General: She is not in acute distress.    Appearance: Normal appearance. She is well-developed. She is not diaphoretic.  HENT:     Head: Normocephalic and atraumatic.  Eyes:     General: No scleral icterus.    Conjunctiva/sclera: Conjunctivae normal.  Neck:     Thyroid: No thyromegaly.  Cardiovascular:     Rate and Rhythm: Normal rate and regular rhythm.     Pulses: Normal pulses.     Heart sounds: Normal heart sounds. No murmur heard. Pulmonary:     Effort: Pulmonary effort is normal. No respiratory distress.     Breath sounds: Normal breath sounds. No wheezing, rhonchi or rales.  Abdominal:     General: There is no distension.     Palpations: Abdomen is soft.     Tenderness: There is no abdominal tenderness.  Musculoskeletal:     Cervical back: Neck supple.     Right lower leg: No edema.     Left lower leg: No edema.  Lymphadenopathy:     Cervical: No cervical adenopathy.  Skin:    General: Skin is warm and dry.     Findings: No rash.  Neurological:     Mental Status: She is alert and oriented to person, place, and time. Mental status is at baseline.  Psychiatric:        Mood and Affect: Mood normal.        Behavior: Behavior normal.      Last depression screening scores PHQ 2/9 Scores 08/31/2021 08/30/2020 08/26/2019  PHQ - 2 Score 0 0 0  PHQ- 9 Score 0 0 0   Last fall risk screening Fall Risk  08/31/2021  Falls in the past year? 0  Number falls in past yr: 0  Injury with Fall? 0  Risk for fall due to : No Fall Risks  Follow up Falls evaluation completed   Last  Audit-C alcohol use screening Alcohol Use Disorder Test (AUDIT) 08/30/2020  1. How often do you have a drink containing alcohol? 4  2. How many drinks containing alcohol do you have on a typical day when you are drinking? 1  3. How often do you have six or more drinks on one occasion? 1  AUDIT-C Score 6  4. How often during the  last year have you found that you were not able to stop drinking once you had started? 0  5. How often during the last year have you failed to do what was normally expected from you because of drinking? 0  6. How often during the last year have you needed a first drink in the morning to get yourself going after a heavy drinking session? 0  7. How often during the last year have you had a feeling of guilt of remorse after drinking? 0  8. How often during the last year have you been unable to remember what happened the night before because you had been drinking? 0  9. Have you or someone else been injured as a result of your drinking? 0  10. Has a relative or friend or a doctor or another health worker been concerned about your drinking or suggested you cut down? 0  Alcohol Use Disorder Identification Test Final Score (AUDIT) 6  Alcohol Brief Interventions/Follow-up AUDIT Score <7 follow-up not indicated   A score of 3 or more in women, and 4 or more in men indicates increased risk for alcohol abuse, EXCEPT if all of the points are from question 1   No results found for any visits on 08/31/21.  Assessment & Plan    Routine Health Maintenance and Physical Exam  Exercise Activities and Dietary recommendations  Goals   None     Immunization History  Administered Date(s) Administered   Influenza Split 06/07/2009, 05/27/2010, 06/09/2011, 07/15/2012   Influenza Whole 08/06/2005   Influenza,inj,Quad PF,6+ Mos 07/18/2013, 07/15/2014, 07/23/2015, 05/19/2018   Influenza-Unspecified 06/12/2017, 06/19/2019, 06/14/2020, 05/19/2021   PFIZER(Purple Top)SARS-COV-2 Vaccination  11/20/2019, 12/15/2019, 07/14/2020   Td 08/06/2005, 06/16/2011, 01/10/2016   Tdap 06/16/2011, 01/10/2016    Health Maintenance  Topic Date Due   Pneumococcal Vaccine 74-43 Years old (1 - PCV) Never done   COVID-19 Vaccine (4 - Booster for Pfizer series) 09/08/2020   PAP SMEAR-Modifier  05/07/2022   TETANUS/TDAP  01/09/2026   COLONOSCOPY (Pts 45-77yrs Insurance coverage will need to be confirmed)  09/30/2030   INFLUENZA VACCINE  Completed   Hepatitis C Screening  Completed   HIV Screening  Completed   HPV VACCINES  Aged Out    Discussed health benefits of physical activity, and encouraged her to engage in regular exercise appropriate for her age and condition.  Problem List Items Addressed This Visit       Endocrine   Subclinical hypothyroidism   Relevant Orders   TSH     Other   Hypercholesteremia   Relevant Orders   Comprehensive metabolic panel   Lipid panel   History of anemia   Relevant Orders   CBC   Blood glucose elevated   Relevant Orders   Hemoglobin A1c   Overweight (BMI 25.0-29.9)   Other Visit Diagnoses     Encounter for annual physical exam    -  Primary   Relevant Orders   Comprehensive metabolic panel   Lipid panel   MM DIAG BREAST TOMO BILATERAL   Hemoglobin A1c   CBC   TSH   Encounter for screening mammogram for malignant neoplasm of breast       Relevant Orders   MM DIAG BREAST TOMO BILATERAL        Return in about 1 year (around 08/31/2022) for CPE.     I, Lavon Paganini, MD, have reviewed all documentation for this visit. The documentation on 08/31/21 for the exam, diagnosis, procedures, and orders are  all accurate and complete.   Clayson Riling, Dionne Bucy, MD, MPH [facility]

## 2021-08-31 ENCOUNTER — Encounter: Payer: Self-pay | Admitting: Family Medicine

## 2021-08-31 ENCOUNTER — Ambulatory Visit (INDEPENDENT_AMBULATORY_CARE_PROVIDER_SITE_OTHER): Payer: BC Managed Care – PPO | Admitting: Family Medicine

## 2021-08-31 ENCOUNTER — Other Ambulatory Visit: Payer: Self-pay

## 2021-08-31 VITALS — BP 120/81 | HR 83 | Temp 98.8°F | Resp 16 | Ht 64.0 in | Wt 167.0 lb

## 2021-08-31 DIAGNOSIS — E038 Other specified hypothyroidism: Secondary | ICD-10-CM

## 2021-08-31 DIAGNOSIS — E663 Overweight: Secondary | ICD-10-CM

## 2021-08-31 DIAGNOSIS — Z0000 Encounter for general adult medical examination without abnormal findings: Secondary | ICD-10-CM | POA: Diagnosis not present

## 2021-08-31 DIAGNOSIS — R739 Hyperglycemia, unspecified: Secondary | ICD-10-CM | POA: Diagnosis not present

## 2021-08-31 DIAGNOSIS — E7800 Pure hypercholesterolemia, unspecified: Secondary | ICD-10-CM

## 2021-08-31 DIAGNOSIS — Z1231 Encounter for screening mammogram for malignant neoplasm of breast: Secondary | ICD-10-CM

## 2021-08-31 DIAGNOSIS — Z862 Personal history of diseases of the blood and blood-forming organs and certain disorders involving the immune mechanism: Secondary | ICD-10-CM

## 2021-09-01 DIAGNOSIS — Z862 Personal history of diseases of the blood and blood-forming organs and certain disorders involving the immune mechanism: Secondary | ICD-10-CM | POA: Diagnosis not present

## 2021-09-01 DIAGNOSIS — E7800 Pure hypercholesterolemia, unspecified: Secondary | ICD-10-CM | POA: Diagnosis not present

## 2021-09-01 DIAGNOSIS — E038 Other specified hypothyroidism: Secondary | ICD-10-CM | POA: Diagnosis not present

## 2021-09-01 DIAGNOSIS — R739 Hyperglycemia, unspecified: Secondary | ICD-10-CM | POA: Diagnosis not present

## 2021-09-01 DIAGNOSIS — Z0000 Encounter for general adult medical examination without abnormal findings: Secondary | ICD-10-CM | POA: Diagnosis not present

## 2021-09-02 LAB — COMPREHENSIVE METABOLIC PANEL
ALT: 12 IU/L (ref 0–32)
AST: 13 IU/L (ref 0–40)
Albumin/Globulin Ratio: 2.4 — ABNORMAL HIGH (ref 1.2–2.2)
Albumin: 4.5 g/dL (ref 3.8–4.8)
Alkaline Phosphatase: 48 IU/L (ref 44–121)
BUN/Creatinine Ratio: 16 (ref 9–23)
BUN: 12 mg/dL (ref 6–24)
Bilirubin Total: 0.8 mg/dL (ref 0.0–1.2)
CO2: 23 mmol/L (ref 20–29)
Calcium: 8.9 mg/dL (ref 8.7–10.2)
Chloride: 105 mmol/L (ref 96–106)
Creatinine, Ser: 0.77 mg/dL (ref 0.57–1.00)
Globulin, Total: 1.9 g/dL (ref 1.5–4.5)
Glucose: 90 mg/dL (ref 70–99)
Potassium: 4.3 mmol/L (ref 3.5–5.2)
Sodium: 138 mmol/L (ref 134–144)
Total Protein: 6.4 g/dL (ref 6.0–8.5)
eGFR: 95 mL/min/{1.73_m2} (ref >59)

## 2021-09-02 LAB — CBC
Hematocrit: 42.1 % (ref 34.0–46.6)
Hemoglobin: 14.4 g/dL (ref 11.1–15.9)
MCH: 31.5 pg (ref 26.6–33.0)
MCHC: 34.2 g/dL (ref 31.5–35.7)
MCV: 92 fL (ref 79–97)
Platelets: 361 10*3/uL (ref 150–450)
RBC: 4.57 x10E6/uL (ref 3.77–5.28)
RDW: 12.3 % (ref 11.7–15.4)
WBC: 4.2 10*3/uL (ref 3.4–10.8)

## 2021-09-02 LAB — LIPID PANEL
Chol/HDL Ratio: 2.9 ratio (ref 0.0–4.4)
Cholesterol, Total: 198 mg/dL (ref 100–199)
HDL: 68 mg/dL (ref >39)
LDL Chol Calc (NIH): 112 mg/dL — ABNORMAL HIGH (ref 0–99)
Triglycerides: 101 mg/dL (ref 0–149)
VLDL Cholesterol Cal: 18 mg/dL (ref 5–40)

## 2021-09-02 LAB — HEMOGLOBIN A1C
Est. average glucose Bld gHb Est-mCnc: 105 mg/dL
Hgb A1c MFr Bld: 5.3 % (ref 4.8–5.6)

## 2021-09-02 LAB — TSH: TSH: 2.39 u[IU]/mL (ref 0.450–4.500)

## 2021-10-05 ENCOUNTER — Ambulatory Visit
Admission: RE | Admit: 2021-10-05 | Discharge: 2021-10-05 | Disposition: A | Discharge status: Home / Self Care | Payer: BC Managed Care – PPO | Source: Ambulatory Visit | Attending: Family Medicine | Admitting: Family Medicine

## 2021-10-05 ENCOUNTER — Other Ambulatory Visit: Payer: Self-pay

## 2021-10-05 DIAGNOSIS — Z1231 Encounter for screening mammogram for malignant neoplasm of breast: Secondary | ICD-10-CM | POA: Insufficient documentation

## 2021-10-05 DIAGNOSIS — N6489 Other specified disorders of breast: Secondary | ICD-10-CM | POA: Diagnosis not present

## 2021-10-05 DIAGNOSIS — Z0000 Encounter for general adult medical examination without abnormal findings: Secondary | ICD-10-CM | POA: Insufficient documentation

## 2021-10-05 DIAGNOSIS — R922 Inconclusive mammogram: Secondary | ICD-10-CM | POA: Diagnosis not present

## 2021-11-20 ENCOUNTER — Telehealth: Payer: Self-pay | Admitting: Family Medicine

## 2021-11-20 NOTE — Telephone Encounter (Signed)
Pt wants to know if Dr B will remove a skin tag on her? ?It is at her bra line and she currently has to wear a band aid on it due to irritation. ?Pt can go to her dermatologist if Dr B will not, but wanted to start here first.  ?

## 2021-11-20 NOTE — Telephone Encounter (Signed)
Appointment scheduled.

## 2021-11-20 NOTE — Telephone Encounter (Signed)
We can remove it here if she is ok with that.  Can you see what appts are avilable later this week ?

## 2021-11-23 NOTE — Progress Notes (Signed)
?  ? ?  I,Sulibeya S Dimas,acting as a Neurosurgeon for Shirlee Latch, MD.,have documented all relevant documentation on the behalf of Shirlee Latch, MD,as directed by  Shirlee Latch, MD while in the presence of Shirlee Latch, MD. ? ? ?Established patient visit ? ? ?Patient: Krista Mosley   DOB: September 04, 1971   51 y.o. Female  MRN: [id number] ?Visit Date: 11/24/2021 ? ?Today's healthcare provider: Shirlee Latch, MD  ? ?Chief Complaint  ?Patient presents with  ? Skin Tag  ? ?Subjective  ?  ?HPI  ?Patient here today requesting to have skin tag removed. Patient reports skin tag is right under left arm around bra area.  ? ?It is irritated by bra line ? ?Medications: ?Outpatient Medications Prior to Visit  ?Medication Sig  ? Cetirizine HCl 10 MG CAPS Take by mouth.  ? levonorgestrel-ethinyl estradiol (SEASONALE) 0.15-0.03 MG tablet Take 1 tablet by mouth daily.  ? ?No facility-administered medications prior to visit.  ? ? ?Review of Systems per HPI ? ? ?  Objective  ?  ?BP 113/76 (BP Location: Left Arm, Patient Position: Sitting, Cuff Size: Large)   Pulse 96   Temp 97.6 ?F (36.4 ?C) (Temporal)   Resp 16   Wt 164 lb (74.4 kg)   BMI 28.15 kg/m?  ? ? ?Physical Exam ?Vitals reviewed.  ?Constitutional:   ?   General: She is not in acute distress. ?   Appearance: She is well-developed.  ?HENT:  ?   Head: Normocephalic and atraumatic.  ?Eyes:  ?   General: No scleral icterus. ?   Conjunctiva/sclera: Conjunctivae normal.  ?Cardiovascular:  ?   Rate and Rhythm: Normal rate and regular rhythm.  ?Pulmonary:  ?   Effort: Pulmonary effort is normal. No respiratory distress.  ?Skin: ?   General: Skin is warm and dry.  ?   Findings: No rash.  ?   Comments: Small erythematous skin tag on bra line under L arm  ?Neurological:  ?   Mental Status: She is alert and oriented to person, place, and time.  ?Psychiatric:     ?   Behavior: Behavior normal.  ?  ? ? ?No results found for any visits on 11/24/21. ? Assessment & Plan  ?   ? ?1. Inflamed skin tag ?- new problem  ?- slightly irritated ?- skin tag was elevated and cut at the base ?- no lidocaine used after discussion with patient   ?- keep area clean and dry ? ?Return if symptoms worsen or fail to improve.  ?   ? ?I, Shirlee Latch, MD, have reviewed all documentation for this visit. The documentation on 11/24/21 for the exam, diagnosis, procedures, and orders are all accurate and complete. ? ? ?Erasmo Downer, MD, MPH ? Family Practice ?[facility]   ?

## 2021-11-24 ENCOUNTER — Ambulatory Visit: Payer: BC Managed Care – PPO | Admitting: Family Medicine

## 2021-11-24 ENCOUNTER — Other Ambulatory Visit: Payer: Self-pay

## 2021-11-24 ENCOUNTER — Encounter: Payer: Self-pay | Admitting: Family Medicine

## 2021-11-24 VITALS — BP 113/76 | HR 96 | Temp 97.6°F | Resp 16 | Wt 164.0 lb

## 2021-11-24 DIAGNOSIS — L918 Other hypertrophic disorders of the skin: Secondary | ICD-10-CM

## 2022-02-13 ENCOUNTER — Encounter: Payer: Self-pay | Admitting: Family Medicine

## 2022-02-14 ENCOUNTER — Encounter: Payer: Self-pay | Admitting: Physician Assistant

## 2022-02-14 ENCOUNTER — Ambulatory Visit (INDEPENDENT_AMBULATORY_CARE_PROVIDER_SITE_OTHER): Payer: BC Managed Care – PPO | Admitting: Physician Assistant

## 2022-02-14 VITALS — BP 117/74 | HR 69 | Temp 98.2°F | Resp 16 | Wt 164.1 lb

## 2022-02-14 DIAGNOSIS — R21 Rash and other nonspecific skin eruption: Secondary | ICD-10-CM

## 2022-02-14 NOTE — Progress Notes (Signed)
      Krista Mosley,Krista Mosley,acting as a Neurosurgeon for [employer], Mosley.,have documented all relevant documentation on the behalf of Krista Mosley,as directed by  [employer], Mosley while in the presence of [employer], Mosley.  Established patient visit   Patient: Krista Mosley   DOB: 08-01-1971   51 y.o. Female  MRN: [id number] Visit Date: 02/14/2022  Today's healthcare provider: Debera Mosley, Mosley   No chief complaint on file.  Subjective    Krista Mosley is a 51 yr old female presenting for red, dry spot on right knee.  Noticed 1 month ago.  [place]. Some mild itching around area.  Patient has treated with wart stick with no improvement. Wonders if it's ringworm.   Does have pets at home/cats No one in her family has similar rash.   Medications: Outpatient Medications Prior to Visit  Medication Sig   Cetirizine HCl 10 MG CAPS Take by mouth.   levonorgestrel-ethinyl estradiol (SEASONALE) 0.15-0.03 MG tablet Take 1 tablet by mouth daily.   No facility-administered medications prior to visit.    Review of Systems  Respiratory: Negative.    Cardiovascular: Negative.   Gastrointestinal: Negative.   Musculoskeletal: Negative.   Skin:  Positive for rash.       Itchy lesion  Neurological: Negative.       Objective    BP 117/74 (BP Location: Right Leg, Patient Position: Sitting, Cuff Size: Normal)   Pulse 69   Temp 98.2 F (36.8 C) (Oral)   Resp 16   Wt 164 lb 1.6 oz (74.4 kg)   SpO2 100%   BMI 28.17 kg/m    Physical Exam Vitals reviewed.  Constitutional:      Appearance: Normal appearance.  HENT:     Head: Normocephalic and atraumatic.  Skin:    General: Skin is warm.     Findings: Rash present.     Comments: Both legs are covered with small insect bites. Well-demarcated circular pruritic patch 3 x 3 cm with central scaling noted on the latero inferior aspect of the right knee  Neurological:     Mental Status: She is alert.      No results found for any visits on 02/14/22.  Assessment & Plan     1. Skin rash Could be due to fungal infection vs tick bite vs contact dermatitis - Lyme Disease Serology w/Reflex To rule out tick bite/Lyme disease  Burow' solution soak and antifungal OTC cream was recommended for 4 weeks  Fu in 2 weeks  The patient was advised to call back or seek an in-person evaluation if the symptoms worsen or if the condition fails to improve as anticipated.  Krista Mosley discussed the assessment and treatment plan with the patient. The patient was provided an opportunity to ask questions and all were answered. The patient agreed with the plan and demonstrated an understanding of the instructions.  The entirety of the information documented in the History of Present Illness, Review of Systems and Physical Exam were personally obtained by me. Portions of this information were initially documented by the CMA and reviewed by me for thoroughness and accuracy.  Portions of this note were created using dictation software and may contain typographical errors.    Krista Mosley  [facility] [phone] (phone) [phone] (fax)  [facility]

## 2022-02-15 LAB — LYME DISEASE SEROLOGY W/REFLEX: Lyme Total Antibody EIA: NEGATIVE

## 2022-02-27 NOTE — Progress Notes (Unsigned)
      Established patient visit   Patient: Krista Mosley   DOB: 08-04-1971   51 y.o. Female  MRN: [id number] Visit Date: 02/28/2022  Today's healthcare provider: Debera Lat, PA-C   No chief complaint on file.  Subjective    HPI  Follow up for skin rash  The patient was last seen for this 2 weeks ago. Changes made at last visit include - Lyme Disease Serology w/Reflex/negative. To rule out tick bite/Lyme disease Burow' solution soak and antifungal OTC cream was recommended for 4 weeks.  She reports {excellent/good/fair/poor:19665} compliance with treatment. She feels that condition is {improved/worse/unchanged:3041574}. She {is/is not:21021397} having side effects. ***  -----------------------------------------------------------------------------------------   Medications: Outpatient Medications Prior to Visit  Medication Sig   Cetirizine HCl 10 MG CAPS Take by mouth.   fluticasone (FLONASE) 50 MCG/ACT nasal spray Place into both nostrils daily.   No facility-administered medications prior to visit.    Review of Systems  {Labs  Heme  Chem  Endocrine  Serology  Results Review (optional):23779}   Objective    There were no vitals taken for this visit. {Show previous vital signs (optional):23777}  Physical Exam  ***  No results found for any visits on 02/28/22.  Assessment & Plan     ***  No follow-ups on file.      {provider attestation***:1}   Debera Lat, Cordelia Poche  [facility] [phone] (phone) [phone] (fax)  [facility]

## 2022-02-28 ENCOUNTER — Ambulatory Visit: Payer: BC Managed Care – PPO | Admitting: Physician Assistant

## 2022-02-28 VITALS — BP 123/89 | HR 75 | Temp 98.6°F | Wt 164.0 lb

## 2022-02-28 DIAGNOSIS — R21 Rash and other nonspecific skin eruption: Secondary | ICD-10-CM

## 2022-03-02 DIAGNOSIS — H524 Presbyopia: Secondary | ICD-10-CM | POA: Diagnosis not present

## 2022-03-14 ENCOUNTER — Encounter: Payer: Self-pay | Admitting: Physician Assistant

## 2022-03-16 ENCOUNTER — Telehealth: Payer: Self-pay | Admitting: Physician Assistant

## 2022-03-16 NOTE — Telephone Encounter (Signed)
Spoke with pt via phone. Discussed that if it is not improved on antifungal treatment, we could rule out ringworm of body. It could be possible nummular dermatitis/atopic or contact dermatitis, lichen planus. He has a hx of allergic rhinitis. She might need to get in touch with her dermatology to set up an appt and she might need to start top steroid if they will advise.

## 2022-03-21 DIAGNOSIS — D2272 Melanocytic nevi of left lower limb, including hip: Secondary | ICD-10-CM | POA: Diagnosis not present

## 2022-03-21 DIAGNOSIS — D2262 Melanocytic nevi of left upper limb, including shoulder: Secondary | ICD-10-CM | POA: Diagnosis not present

## 2022-03-21 DIAGNOSIS — D225 Melanocytic nevi of trunk: Secondary | ICD-10-CM | POA: Diagnosis not present

## 2022-03-21 DIAGNOSIS — D2261 Melanocytic nevi of right upper limb, including shoulder: Secondary | ICD-10-CM | POA: Diagnosis not present

## 2022-05-24 NOTE — Progress Notes (Unsigned)
     I,Roshena L Chambers,acting as a Neurosurgeon for [employer], PA-C.,have documented all relevant documentation on the behalf of Debera Lat, PA-C,as directed by  [employer], PA-C while in the presence of [employer], PA-C.    Established patient visit   Patient: Krista Mosley   DOB: 20-May-1971   51 y.o. Female  MRN: [id number] Visit Date: 05/25/2022  Today's healthcare provider: Debera Lat, PA-C   Chief Complaint  Patient presents with   Rash   Subjective    Rash This is a new problem. Episode onset: 1 month ago after gardening. The problem has been gradually worsening since onset. The affected locations include the left arm, right arm, left lower leg and right lower leg. The rash is characterized by itchiness and redness. She was exposed to plant contact. Pertinent negatives include no fatigue, fever, shortness of breath or vomiting. Treatments tried: Benadryl and Calomine lotion. The treatment provided mild relief.      Medications: Outpatient Medications Prior to Visit  Medication Sig   Cetirizine HCl 10 MG CAPS Take by mouth.   fluticasone (FLONASE) 50 MCG/ACT nasal spray Place into both nostrils daily.   levonorgestrel-ethinyl estradiol (SEASONALE) 0.15-0.03 MG tablet Take 1 tablet by mouth daily.   No facility-administered medications prior to visit.    Review of Systems  Constitutional:  Negative for appetite change, chills, fatigue and fever.  Respiratory:  Negative for chest tightness and shortness of breath.   Cardiovascular:  Negative for chest pain and palpitations.  Gastrointestinal:  Negative for abdominal pain, nausea and vomiting.  Skin:  Positive for rash.  Neurological:  Negative for dizziness and weakness.    {Labs  Heme  Chem  Endocrine  Serology  Results Review (optional):23779}   Objective    BP 114/79 (BP Location: Right Arm, Patient Position: Sitting, Cuff Size: Normal)   Pulse 85   Temp 98.2 F (36.8 C) (Oral)   Resp 14   Wt  167 lb (75.8 kg)   LMP  (Within Weeks)   SpO2 99% Comment: room air  BMI 28.67 kg/m  {Show previous vital signs (optional):23777}  Physical Exam Skin:    Comments: Minute papules arranged into the round configuration on the lower legs and scattered papules arranged into linear pattern on the left forearm and right foream     ***  No results found for any visits on 05/25/22.  Assessment & Plan     ***  No follow-ups on file.      {provider attestation***:1}   Debera Lat, Cordelia Poche  [facility] [phone] (phone) [phone] (fax)  [facility]

## 2022-05-25 ENCOUNTER — Ambulatory Visit: Payer: BC Managed Care – PPO | Admitting: Physician Assistant

## 2022-05-25 ENCOUNTER — Encounter: Payer: Self-pay | Admitting: Physician Assistant

## 2022-05-25 VITALS — BP 114/79 | HR 85 | Temp 98.2°F | Resp 14 | Wt 167.0 lb

## 2022-05-25 DIAGNOSIS — L299 Pruritus, unspecified: Secondary | ICD-10-CM

## 2022-05-25 DIAGNOSIS — L249 Irritant contact dermatitis, unspecified cause: Secondary | ICD-10-CM | POA: Diagnosis not present

## 2022-05-25 MED ORDER — PREDNISONE 10 MG (21) PO TBPK: ORAL_TABLET | ORAL | 0 refills | Status: DC

## 2022-05-25 MED ORDER — HYDROXYZINE PAMOATE 25 MG PO CAPS: 25.0000 mg | ORAL_CAPSULE | Freq: Three times a day (TID) | ORAL | 0 refills | Status: DC | PRN

## 2022-05-29 DIAGNOSIS — L300 Nummular dermatitis: Secondary | ICD-10-CM | POA: Diagnosis not present

## 2022-05-29 DIAGNOSIS — L237 Allergic contact dermatitis due to plants, except food: Secondary | ICD-10-CM | POA: Diagnosis not present

## 2022-05-29 DIAGNOSIS — L0101 Non-bullous impetigo: Secondary | ICD-10-CM | POA: Diagnosis not present

## 2022-07-13 DIAGNOSIS — L258 Unspecified contact dermatitis due to other agents: Secondary | ICD-10-CM | POA: Diagnosis not present

## 2022-07-13 DIAGNOSIS — L309 Dermatitis, unspecified: Secondary | ICD-10-CM | POA: Diagnosis not present

## 2022-07-23 DIAGNOSIS — L2089 Other atopic dermatitis: Secondary | ICD-10-CM | POA: Diagnosis not present

## 2022-07-25 DIAGNOSIS — L309 Dermatitis, unspecified: Secondary | ICD-10-CM | POA: Diagnosis not present

## 2022-07-27 DIAGNOSIS — L239 Allergic contact dermatitis, unspecified cause: Secondary | ICD-10-CM | POA: Diagnosis not present

## 2022-08-21 NOTE — Progress Notes (Unsigned)
I,Sanchez Hemmer S Soraiya Ahner,acting as a Education administrator for Lavon Paganini, MD.,have documented all relevant documentation on the behalf of Lavon Paganini, MD,as directed by  Lavon Paganini, MD while in the presence of Lavon Paganini, MD.    Complete physical exam   Patient: Krista Mosley   DOB: 10-20-1970   51 y.o. Female  MRN: [id number] Visit Date: 08/23/2022  Today's healthcare provider: Lavon Paganini, MD   No chief complaint on file.  Subjective    Krista Mosley is a 51 y.o. female who presents today for a complete physical exam.  She reports consuming a {diet types:17450} diet. {Exercise:19826} She generally feels {well/fairly well/poorly:18703}. She reports sleeping {well/fairly well/poorly:18703}. She {does/does not:200015} have additional problems to discuss today.  HPI    Past Medical History:  Diagnosis Date   H/O knee surgery    Past Surgical History:  Procedure Laterality Date   CESAREAN SECTION     COLONOSCOPY WITH PROPOFOL N/A 09/30/2020   Procedure: COLONOSCOPY WITH PROPOFOL;  Surgeon: Jonathon Bellows, MD;  Location: [facility] ENDOSCOPY;  Service: Gastroenterology;  Laterality: N/A;   cryotherapy     EXTERNAL EAR SURGERY     KNEE SURGERY     LEEP     MENISCUS REPAIR     Social History   Socioeconomic History   Marital status: Married    Spouse name: Not on file   Number of children: 2   Years of education: Not on file   Highest education level: Not on file  Occupational History   Not on file  Tobacco Use   Smoking status: Never   Smokeless tobacco: Never  Vaping Use   Vaping Use: Never used  Substance and Sexual Activity   Alcohol use: Yes    Alcohol/week: 5.0 standard drinks of alcohol    Types: 5 Glasses of wine per week   Drug use: No   Sexual activity: Yes    Birth control/protection: None  Other Topics Concern   Not on file  Social History Narrative   Not on file   Social Determinants of Health   Financial Resource Strain: Not on file   Food Insecurity: Not on file  Transportation Needs: Not on file  Physical Activity: Not on file  Stress: Not on file  Social Connections: Not on file  Intimate Partner Violence: Not on file   Family Status  Relation Name Status   Mother  Alive   Father  Alive   Brother  Alive   Brother  Alive   Daughter  Krista Mosley  (Not Specified)   Family History  Problem Relation Age of Onset   Breast cancer Mother 18       Invasive lobular cancer, T3N2 ER+, PR-, Her2 -, metastatic, recurrence after ~67yr   Esophageal cancer Mother    Breast cancer Paternal Aunt 671  Allergies  Allergen Reactions   Codeine     REACTION: NAUSEA + VOMITING    Patient Care Team: BVirginia Crews MD as PCP - General (Family Medicine)   Medications: Outpatient Medications Prior to Visit  Medication Sig   Cetirizine HCl 10 MG CAPS Take by mouth.   fluticasone (FLONASE) 50 MCG/ACT nasal spray Place into both nostrils daily.   hydrOXYzine (VISTARIL) 25 MG capsule Take 1 capsule (25 mg total) by mouth every 8 (eight) hours as needed.   levonorgestrel-ethinyl estradiol (SEASONALE) 0.15-0.03 MG tablet Take 1 tablet by mouth daily.   predniSONE (STERAPRED UNI-PAK 21 TAB) 10 MG (  21) TBPK tablet 6 tab day 1, 5 tab day 2 , 4 tab day 3, 3 tab day 4, 2 tab day 5, 1 tab day 6   No facility-administered medications prior to visit.    Review of Systems  All other systems reviewed and are negative.   Last CBC Lab Results  Component Value Date   WBC 4.2 09/01/2021   HGB 14.4 09/01/2021   HCT 42.1 09/01/2021   MCV 92 09/01/2021   MCH 31.5 09/01/2021   RDW 12.3 09/01/2021   PLT 361 85/46/2703   Last metabolic panel Lab Results  Component Value Date   GLUCOSE 90 09/01/2021   NA 138 09/01/2021   K 4.3 09/01/2021   CL 105 09/01/2021   CO2 23 09/01/2021   BUN 12 09/01/2021   CREATININE 0.77 09/01/2021   EGFR 95 09/01/2021   CALCIUM 8.9 09/01/2021   PROT 6.4 09/01/2021   ALBUMIN 4.5  09/01/2021   LABGLOB 1.9 09/01/2021   AGRATIO 2.4 (H) 09/01/2021   BILITOT 0.8 09/01/2021   ALKPHOS 48 09/01/2021   AST 13 09/01/2021   ALT 12 09/01/2021   Last lipids Lab Results  Component Value Date   CHOL 198 09/01/2021   HDL 68 09/01/2021   LDLCALC 112 (H) 09/01/2021   TRIG 101 09/01/2021   CHOLHDL 2.9 09/01/2021   Last hemoglobin A1c Lab Results  Component Value Date   HGBA1C 5.3 09/01/2021   Last thyroid functions Lab Results  Component Value Date   TSH 2.390 09/01/2021      Objective    There were no vitals taken for this visit. BP Readings from Last 3 Encounters:  05/25/22 114/79  02/28/22 123/89  02/14/22 117/74   Wt Readings from Last 3 Encounters:  05/25/22 167 lb (75.8 kg)  02/28/22 164 lb (74.4 kg)  02/14/22 164 lb 1.6 oz (74.4 kg)     Physical Exam  ***  Last depression screening scores    05/25/2022    9:06 AM 02/14/2022    8:18 AM 11/24/2021   11:04 AM  PHQ 2/9 Scores  PHQ - 2 Score 0 0 0  PHQ- 9 Score 0 0 0   Last fall risk screening    05/25/2022    9:06 AM  [place] in the past year? 0  Number falls in past yr: 0  Injury with Fall? 0  Risk for fall due to : No Fall Risks  Follow up Falls evaluation completed   Last Audit-C alcohol use screening    02/14/2022    8:18 AM  Alcohol Use Disorder Test (AUDIT)  1. How often do you have a drink containing alcohol? 4  2. How many drinks containing alcohol do you have on a typical day when you are drinking? 1  3. How often do you have six or more drinks on one occasion? 1  AUDIT-C Score 6   A score of 3 or more in women, and 4 or more in men indicates increased risk for alcohol abuse, EXCEPT if all of the points are from question 1   No results found for any visits on 08/23/22.  Assessment & Plan    Routine Health Maintenance and Physical Exam  Exercise Activities and Dietary recommendations  Goals   None     Immunization History  Administered Date(s) Administered    Influenza Split 06/07/2009, 05/27/2010, 06/09/2011, 07/15/2012   Influenza Whole 08/06/2005   Influenza,inj,Quad PF,6+ Mos 07/18/2013, 07/15/2014, 07/23/2015, 05/19/2018  Influenza-Unspecified 06/12/2017, 06/19/2019, 06/14/2020, 05/19/2021   Moderna Sars-Covid-2 Vaccination 06/23/2022   PFIZER(Purple Top)SARS-COV-2 Vaccination 11/20/2019, 12/15/2019, 07/14/2020   Td 08/06/2005, 06/16/2011, 01/10/2016   Tdap 06/16/2011, 01/10/2016   Zoster Recombinat (Shingrix) 06/23/2022    Health Maintenance  Topic Date Due   PAP SMEAR-Modifier  05/07/2022   COVID-19 Vaccine (5 - 2023-24 season) 08/18/2022   Zoster Vaccines- Shingrix (2 of 2) 08/18/2022   INFLUENZA VACCINE  12/09/2022 (Originally 04/10/2022)   MAMMOGRAM  10/06/2023   DTaP/Tdap/Td (6 - Td or Tdap) 01/09/2026   COLONOSCOPY (Pts 45-35yr Insurance coverage will need to be confirmed)  09/30/2030   Hepatitis C Screening  Completed   HIV Screening  Completed   HPV VACCINES  Aged Out    Discussed health benefits of physical activity, and encouraged her to engage in regular exercise appropriate for her age and condition.  ***  No follow-ups on file.     {provider attestation***:1}   ALavon Paganini MD  [phone](phone) [phone](fax)  [facility]

## 2022-08-23 ENCOUNTER — Other Ambulatory Visit (HOSPITAL_COMMUNITY)
Admission: RE | Admit: 2022-08-23 | Discharge: 2022-08-23 | Disposition: A | Discharge status: Home / Self Care | Payer: BC Managed Care – PPO | Source: Ambulatory Visit | Attending: Family Medicine | Admitting: Family Medicine

## 2022-08-23 ENCOUNTER — Encounter: Payer: Self-pay | Admitting: Family Medicine

## 2022-08-23 ENCOUNTER — Ambulatory Visit (INDEPENDENT_AMBULATORY_CARE_PROVIDER_SITE_OTHER): Payer: BC Managed Care – PPO | Admitting: Family Medicine

## 2022-08-23 VITALS — BP 117/83 | HR 82 | Temp 98.6°F | Resp 16 | Ht 63.0 in | Wt 169.6 lb

## 2022-08-23 DIAGNOSIS — Z862 Personal history of diseases of the blood and blood-forming organs and certain disorders involving the immune mechanism: Secondary | ICD-10-CM

## 2022-08-23 DIAGNOSIS — Z0000 Encounter for general adult medical examination without abnormal findings: Secondary | ICD-10-CM

## 2022-08-23 DIAGNOSIS — Z124 Encounter for screening for malignant neoplasm of cervix: Secondary | ICD-10-CM | POA: Diagnosis not present

## 2022-08-23 DIAGNOSIS — E038 Other specified hypothyroidism: Secondary | ICD-10-CM

## 2022-08-23 DIAGNOSIS — E7800 Pure hypercholesterolemia, unspecified: Secondary | ICD-10-CM | POA: Diagnosis not present

## 2022-08-23 DIAGNOSIS — E663 Overweight: Secondary | ICD-10-CM | POA: Diagnosis not present

## 2022-08-23 DIAGNOSIS — R739 Hyperglycemia, unspecified: Secondary | ICD-10-CM

## 2022-08-23 DIAGNOSIS — Z1231 Encounter for screening mammogram for malignant neoplasm of breast: Secondary | ICD-10-CM

## 2022-08-23 MED ORDER — LEVONORGEST-ETH ESTRAD 91-DAY 0.15-0.03 MG PO TABS: 1.0000 | ORAL_TABLET | Freq: Every day | ORAL | 3 refills | Status: DC

## 2022-08-23 NOTE — Assessment & Plan Note (Signed)
Recommend low carb diet °Recheck A1c  °

## 2022-08-23 NOTE — Assessment & Plan Note (Signed)
Reviewed last lipid panel Not currently on a statin Recheck FLP and CMP Discussed diet and exercise  

## 2022-08-24 LAB — COMPREHENSIVE METABOLIC PANEL
ALT: 39 IU/L — ABNORMAL HIGH (ref 0–32)
AST: 21 IU/L (ref 0–40)
Albumin/Globulin Ratio: 2.3 — ABNORMAL HIGH (ref 1.2–2.2)
Albumin: 4.6 g/dL (ref 3.9–4.9)
Alkaline Phosphatase: 43 IU/L — ABNORMAL LOW (ref 44–121)
BUN/Creatinine Ratio: 13 (ref 9–23)
BUN: 11 mg/dL (ref 6–24)
Bilirubin Total: 0.8 mg/dL (ref 0.0–1.2)
CO2: 22 mmol/L (ref 20–29)
Calcium: 9.2 mg/dL (ref 8.7–10.2)
Chloride: 104 mmol/L (ref 96–106)
Creatinine, Ser: 0.85 mg/dL (ref 0.57–1.00)
Globulin, Total: 2 g/dL (ref 1.5–4.5)
Glucose: 91 mg/dL (ref 70–99)
Potassium: 4.5 mmol/L (ref 3.5–5.2)
Sodium: 138 mmol/L (ref 134–144)
Total Protein: 6.6 g/dL (ref 6.0–8.5)
eGFR: 83 mL/min/{1.73_m2} (ref >59)

## 2022-08-24 LAB — CBC WITH DIFFERENTIAL/PLATELET
Basophils Absolute: 0 10*3/uL (ref 0.0–0.2)
Basos: 1 %
EOS (ABSOLUTE): 0.1 10*3/uL (ref 0.0–0.4)
Eos: 1 %
Hematocrit: 41 % (ref 34.0–46.6)
Hemoglobin: 13.8 g/dL (ref 11.1–15.9)
Immature Grans (Abs): 0 10*3/uL (ref 0.0–0.1)
Immature Granulocytes: 0 %
Lymphocytes Absolute: 1.5 10*3/uL (ref 0.7–3.1)
Lymphs: 30 %
MCH: 31.2 pg (ref 26.6–33.0)
MCHC: 33.7 g/dL (ref 31.5–35.7)
MCV: 93 fL (ref 79–97)
Monocytes Absolute: 0.4 10*3/uL (ref 0.1–0.9)
Monocytes: 8 %
Neutrophils Absolute: 2.9 10*3/uL (ref 1.4–7.0)
Neutrophils: 60 %
Platelets: 337 10*3/uL (ref 150–450)
RBC: 4.42 x10E6/uL (ref 3.77–5.28)
RDW: 12.2 % (ref 11.7–15.4)
WBC: 4.9 10*3/uL (ref 3.4–10.8)

## 2022-08-24 LAB — HEMOGLOBIN A1C
Est. average glucose Bld gHb Est-mCnc: 108 mg/dL
Hgb A1c MFr Bld: 5.4 % (ref 4.8–5.6)

## 2022-08-24 LAB — LIPID PANEL WITH LDL/HDL RATIO
Cholesterol, Total: 200 mg/dL — ABNORMAL HIGH (ref 100–199)
HDL: 79 mg/dL (ref >39)
LDL Chol Calc (NIH): 102 mg/dL — ABNORMAL HIGH (ref 0–99)
LDL/HDL Ratio: 1.3 ratio (ref 0.0–3.2)
Triglycerides: 106 mg/dL (ref 0–149)
VLDL Cholesterol Cal: 19 mg/dL (ref 5–40)

## 2022-08-24 LAB — TSH: TSH: 2.07 u[IU]/mL (ref 0.450–4.500)

## 2022-08-27 ENCOUNTER — Telehealth: Payer: Self-pay

## 2022-08-27 DIAGNOSIS — R7989 Other specified abnormal findings of blood chemistry: Secondary | ICD-10-CM

## 2022-08-27 LAB — CYTOLOGY - PAP
Adequacy: ABSENT
Comment: NEGATIVE
Diagnosis: NEGATIVE
High risk HPV: NEGATIVE

## 2022-08-27 NOTE — Telephone Encounter (Signed)
-----   Message from Angela M Bacigalupo, MD sent at 08/27/2022  8:12 AM EST ----- Normal/stable labs. The 10-year ASCVD (heart disease and stroke) risk score (Arnett DK, et al., 2019) is: 0.7%, which is quite low.  No need for medication for cholesterol.  We will continue to monitor.  Very slightly elevated liver function tests.  Recommend cutting back on alcohol and tylenol, and hydrating well.  Recheck LFTs in 1 month.  

## 2022-08-27 NOTE — Telephone Encounter (Signed)
-----   Message from Erasmo Downer, MD sent at 08/27/2022  8:12 AM EST ----- Normal/stable labs. The 10-year ASCVD (heart disease and stroke) risk score (Arnett DK, et al., 2019) is: 0.7%, which is quite low.  No need for medication for cholesterol.  We will continue to monitor.  Very slightly elevated liver function tests.  Recommend cutting back on alcohol and tylenol, and hydrating well.  Recheck LFTs in 1 month.

## 2022-09-07 ENCOUNTER — Encounter: Payer: Self-pay | Admitting: Family Medicine

## 2022-10-07 ENCOUNTER — Encounter: Payer: Self-pay | Admitting: Family Medicine

## 2022-10-08 ENCOUNTER — Ambulatory Visit
Admission: RE | Admit: 2022-10-08 | Discharge: 2022-10-08 | Disposition: A | Discharge status: Home / Self Care | Payer: BC Managed Care – PPO | Source: Ambulatory Visit | Attending: Family Medicine | Admitting: Family Medicine

## 2022-10-08 ENCOUNTER — Encounter: Payer: Self-pay | Admitting: Radiology

## 2022-10-08 DIAGNOSIS — Z1231 Encounter for screening mammogram for malignant neoplasm of breast: Secondary | ICD-10-CM | POA: Diagnosis not present

## 2022-10-15 ENCOUNTER — Encounter: Payer: Self-pay | Admitting: Family Medicine

## 2022-10-16 DIAGNOSIS — R7989 Other specified abnormal findings of blood chemistry: Secondary | ICD-10-CM | POA: Diagnosis not present

## 2022-10-17 LAB — HEPATIC FUNCTION PANEL
ALT: 37 IU/L — ABNORMAL HIGH (ref 0–32)
AST: 28 IU/L (ref 0–40)
Albumin: 4.8 g/dL (ref 3.9–4.9)
Alkaline Phosphatase: 63 IU/L (ref 44–121)
Bilirubin Total: 0.7 mg/dL (ref 0.0–1.2)
Bilirubin, Direct: 0.15 mg/dL (ref 0.00–0.40)
Total Protein: 7.2 g/dL (ref 6.0–8.5)

## 2022-10-25 ENCOUNTER — Telehealth: Payer: Self-pay

## 2022-10-25 DIAGNOSIS — L249 Irritant contact dermatitis, unspecified cause: Secondary | ICD-10-CM

## 2022-10-25 NOTE — Telephone Encounter (Signed)
Copied from [place]. Topic: Referral - Request for Referral >> Oct 25, 2022  8:25 AM Everette C wrote: Has patient seen PCP for this complaint? Yes.   *If NO, is insurance requiring patient see PCP for this issue before PCP can refer them? Referral for which specialty: Allergy Specialist  Preferred provider/office: Patient has no preference  Reason for referral: allergy testing

## 2022-10-25 NOTE — Telephone Encounter (Signed)
Dr B- A CRM came in on this patient requesting referral to allergy specialist.  While converting it to a phone message it attached to a mychart message and I was unable to send you the original

## 2022-10-25 NOTE — Telephone Encounter (Signed)
Ok to send referral as requested

## 2022-10-25 NOTE — Addendum Note (Signed)
Addended by: Shawna Orleans on: 10/25/2022 11:23 AM   Modules accepted: Orders

## 2022-11-08 ENCOUNTER — Telehealth: Payer: Self-pay

## 2022-11-08 DIAGNOSIS — R7989 Other specified abnormal findings of blood chemistry: Secondary | ICD-10-CM

## 2022-11-08 NOTE — Telephone Encounter (Signed)
Copied from [place]. Topic: Appointment Scheduling - Scheduling Inquiry for Clinic >> Nov 08, 2022  3:55 PM Sabas Sous wrote: Reason for CRM: Pt wants to come in for her lab re-check per PCP. She wants to come in Monday or Tuesday. Please advise via mychart when labs are ready

## 2022-11-09 NOTE — Telephone Encounter (Signed)
Lab ordered. Mychart message sent to patient.

## 2022-11-12 DIAGNOSIS — R7989 Other specified abnormal findings of blood chemistry: Secondary | ICD-10-CM | POA: Diagnosis not present

## 2022-11-13 LAB — HEPATIC FUNCTION PANEL
ALT: 30 IU/L (ref 0–32)
AST: 20 IU/L (ref 0–40)
Albumin: 4.5 g/dL (ref 3.9–4.9)
Alkaline Phosphatase: 51 IU/L (ref 44–121)
Bilirubin Total: 0.9 mg/dL (ref 0.0–1.2)
Bilirubin, Direct: 0.19 mg/dL (ref 0.00–0.40)
Total Protein: 6.5 g/dL (ref 6.0–8.5)

## 2022-11-15 DIAGNOSIS — J3089 Other allergic rhinitis: Secondary | ICD-10-CM | POA: Diagnosis not present

## 2022-11-15 DIAGNOSIS — R21 Rash and other nonspecific skin eruption: Secondary | ICD-10-CM | POA: Diagnosis not present

## 2023-03-06 ENCOUNTER — Ambulatory Visit: Payer: Self-pay

## 2023-03-06 ENCOUNTER — Other Ambulatory Visit: Payer: Self-pay | Admitting: Family Medicine

## 2023-03-06 DIAGNOSIS — U071 COVID-19: Secondary | ICD-10-CM

## 2023-03-06 MED ORDER — MOLNUPIRAVIR EUA 200MG CAPSULE: 4.0000 | ORAL_CAPSULE | Freq: Two times a day (BID) | ORAL | 0 refills | Status: AC

## 2023-03-06 NOTE — Telephone Encounter (Signed)
Patient advised. Verbalized understanding

## 2023-03-06 NOTE — Telephone Encounter (Addendum)
Summary: congestion, upset stomach, chills, fever on and off   She didn't want an appt  ----- Message from Glean Salen sent at 03/06/2023 12:50 PM EDT ----- Pt called in tested positive for covid  today and wants to know what med she can take besides over the counter. She has congestion, upset stomach, chills, fever on and off     Chief Complaint: COVID positive. Cough, fever, chills.Declines OV, asking if anti-viral would help. Declines OV or VV. Symptoms: Above Frequency: Sunday Pertinent Negatives: Patient denies SOB Disposition: [] ED /[] Urgent Care (no appt availability in office) / [] Appointment(In office/virtual)/ []  [facility]/ [x] Home Care/ [] Refused Recommended Disposition /[] [facility]/ []  Follow-up with PCP Additional Notes: Please advise pt.  Reason for Disposition  [1] COVID-19 diagnosed by doctor (or NP/PA) AND [2] mild symptoms (e.g., cough, fever, others) AND [3] no complications or SOB  Answer Assessment - Initial Assessment Questions 1. COVID-19 DIAGNOSIS: "How do you know that you have COVID?" (e.g., positive lab test or self-test, diagnosed by doctor or NP/PA, symptoms after exposure).     Home test 2. COVID-19 EXPOSURE: "Was there any known exposure to COVID before the symptoms began?" CDC Definition of close contact: within 6 feet (2 meters) for a total of 15 minutes or more over a 24-hour period.      No 3. ONSET: "When did the COVID-19 symptoms start?"      Sunday 4. WORST SYMPTOM: "What is your worst symptom?" (e.g., cough, fever, shortness of breath, muscle aches)     Congestion, chills, fever, upset stomach 5. COUGH: "Do you have a cough?" If Yes, ask: "How bad is the cough?"       Mild 6. FEVER: "Do you have a fever?" If Yes, ask: "What is your temperature, how was it measured, and when did it start?"     Chills 7. RESPIRATORY STATUS: "Describe your breathing?" (e.g., normal; shortness of breath, wheezing, unable to speak)       No 8. BETTER-SAME-WORSE: "Are you getting better, staying the same or getting worse compared to yesterday?"  If getting worse, ask, "In what way?"     Same 9. OTHER SYMPTOMS: "Do you have any other symptoms?"  (e.g., chills, fatigue, headache, loss of smell or taste, muscle pain, sore throat)     No 10. HIGH RISK DISEASE: "Do you have any chronic medical problems?" (e.g., asthma, heart or lung disease, weak immune system, obesity, etc.)       No 11. VACCINE: "Have you had the COVID-19 vaccine?" If Yes, ask: "Which one, how many shots, when did you get it?"       N/a 12. PREGNANCY: "Is there any chance you are pregnant?" "When was your last menstrual period?"       No 13. O2 SATURATION MONITOR:  "Do you use an oxygen saturation monitor (pulse oximeter) at home?" If Yes, ask "What is your reading (oxygen level) today?" "What is your usual oxygen saturation reading?" (e.g., 95%)       No  Protocols used: Coronavirus (COVID-19) Diagnosed or Suspected-A-AH

## 2023-03-06 NOTE — Telephone Encounter (Signed)
Antiviral prescribed. Please advise to take as directed

## 2023-03-22 DIAGNOSIS — D485 Neoplasm of uncertain behavior of skin: Secondary | ICD-10-CM | POA: Diagnosis not present

## 2023-03-22 DIAGNOSIS — D2262 Melanocytic nevi of left upper limb, including shoulder: Secondary | ICD-10-CM | POA: Diagnosis not present

## 2023-03-22 DIAGNOSIS — D2261 Melanocytic nevi of right upper limb, including shoulder: Secondary | ICD-10-CM | POA: Diagnosis not present

## 2023-03-22 DIAGNOSIS — D2271 Melanocytic nevi of right lower limb, including hip: Secondary | ICD-10-CM | POA: Diagnosis not present

## 2023-03-22 DIAGNOSIS — D225 Melanocytic nevi of trunk: Secondary | ICD-10-CM | POA: Diagnosis not present

## 2023-04-11 DIAGNOSIS — R21 Rash and other nonspecific skin eruption: Secondary | ICD-10-CM | POA: Diagnosis not present

## 2023-04-11 DIAGNOSIS — J301 Allergic rhinitis due to pollen: Secondary | ICD-10-CM | POA: Diagnosis not present

## 2023-04-11 DIAGNOSIS — J3089 Other allergic rhinitis: Secondary | ICD-10-CM | POA: Diagnosis not present

## 2023-05-31 DIAGNOSIS — E063 Autoimmune thyroiditis: Secondary | ICD-10-CM | POA: Diagnosis not present

## 2023-05-31 DIAGNOSIS — N951 Menopausal and female climacteric states: Secondary | ICD-10-CM | POA: Diagnosis not present

## 2023-06-10 DIAGNOSIS — E063 Autoimmune thyroiditis: Secondary | ICD-10-CM | POA: Diagnosis not present

## 2023-06-10 DIAGNOSIS — R232 Flushing: Secondary | ICD-10-CM | POA: Diagnosis not present

## 2023-06-10 DIAGNOSIS — L309 Dermatitis, unspecified: Secondary | ICD-10-CM | POA: Diagnosis not present

## 2023-06-10 DIAGNOSIS — Z1331 Encounter for screening for depression: Secondary | ICD-10-CM | POA: Diagnosis not present

## 2023-06-10 DIAGNOSIS — N951 Menopausal and female climacteric states: Secondary | ICD-10-CM | POA: Diagnosis not present

## 2023-06-10 DIAGNOSIS — E669 Obesity, unspecified: Secondary | ICD-10-CM | POA: Diagnosis not present

## 2023-07-22 DIAGNOSIS — E063 Autoimmune thyroiditis: Secondary | ICD-10-CM | POA: Diagnosis not present

## 2023-07-22 DIAGNOSIS — N951 Menopausal and female climacteric states: Secondary | ICD-10-CM | POA: Diagnosis not present

## 2023-07-24 DIAGNOSIS — N951 Menopausal and female climacteric states: Secondary | ICD-10-CM | POA: Diagnosis not present

## 2023-07-24 DIAGNOSIS — E063 Autoimmune thyroiditis: Secondary | ICD-10-CM | POA: Diagnosis not present

## 2023-07-24 DIAGNOSIS — Z6830 Body mass index (BMI) 30.0-30.9, adult: Secondary | ICD-10-CM | POA: Diagnosis not present

## 2023-07-24 DIAGNOSIS — R232 Flushing: Secondary | ICD-10-CM | POA: Diagnosis not present

## 2023-08-02 ENCOUNTER — Encounter: Payer: Self-pay | Admitting: Family Medicine

## 2023-08-02 DIAGNOSIS — E038 Other specified hypothyroidism: Secondary | ICD-10-CM

## 2023-08-02 DIAGNOSIS — E7800 Pure hypercholesterolemia, unspecified: Secondary | ICD-10-CM

## 2023-08-02 DIAGNOSIS — R739 Hyperglycemia, unspecified: Secondary | ICD-10-CM

## 2023-08-02 DIAGNOSIS — Z862 Personal history of diseases of the blood and blood-forming organs and certain disorders involving the immune mechanism: Secondary | ICD-10-CM

## 2023-08-02 DIAGNOSIS — E663 Overweight: Secondary | ICD-10-CM

## 2023-08-13 NOTE — Telephone Encounter (Signed)
Ok to order CBC, CMP, lipid, A1c, TSH - use diagnoses from last years labs. Recommend that she get them at least 2 days before her appt with me. Get them fasting. Thanks

## 2023-08-19 DIAGNOSIS — R739 Hyperglycemia, unspecified: Secondary | ICD-10-CM | POA: Diagnosis not present

## 2023-08-19 DIAGNOSIS — E7800 Pure hypercholesterolemia, unspecified: Secondary | ICD-10-CM | POA: Diagnosis not present

## 2023-08-19 DIAGNOSIS — Z862 Personal history of diseases of the blood and blood-forming organs and certain disorders involving the immune mechanism: Secondary | ICD-10-CM | POA: Diagnosis not present

## 2023-08-19 DIAGNOSIS — E663 Overweight: Secondary | ICD-10-CM | POA: Diagnosis not present

## 2023-08-19 DIAGNOSIS — E038 Other specified hypothyroidism: Secondary | ICD-10-CM | POA: Diagnosis not present

## 2023-08-20 LAB — CBC WITH DIFFERENTIAL/PLATELET
Basophils Absolute: 0 10*3/uL (ref 0.0–0.2)
Basos: 1 %
EOS (ABSOLUTE): 0.1 10*3/uL (ref 0.0–0.4)
Eos: 2 %
Hematocrit: 41.5 % (ref 34.0–46.6)
Hemoglobin: 13.8 g/dL (ref 11.1–15.9)
Immature Grans (Abs): 0 10*3/uL (ref 0.0–0.1)
Immature Granulocytes: 0 %
Lymphocytes Absolute: 1.6 10*3/uL (ref 0.7–3.1)
Lymphs: 38 %
MCH: 31.7 pg (ref 26.6–33.0)
MCHC: 33.3 g/dL (ref 31.5–35.7)
MCV: 95 fL (ref 79–97)
Monocytes Absolute: 0.4 10*3/uL (ref 0.1–0.9)
Monocytes: 9 %
Neutrophils Absolute: 2 10*3/uL (ref 1.4–7.0)
Neutrophils: 50 %
Platelets: 334 10*3/uL (ref 150–450)
RBC: 4.36 x10E6/uL (ref 3.77–5.28)
RDW: 12.7 % (ref 11.7–15.4)
WBC: 4.1 10*3/uL (ref 3.4–10.8)

## 2023-08-20 LAB — COMPREHENSIVE METABOLIC PANEL
ALT: 29 [IU]/L (ref 0–32)
AST: 21 [IU]/L (ref 0–40)
Albumin: 4.3 g/dL (ref 3.8–4.9)
Alkaline Phosphatase: 46 [IU]/L (ref 44–121)
BUN/Creatinine Ratio: 14 (ref 9–23)
BUN: 11 mg/dL (ref 6–24)
Bilirubin Total: 1.1 mg/dL (ref 0.0–1.2)
CO2: 21 mmol/L (ref 20–29)
Calcium: 9 mg/dL (ref 8.7–10.2)
Chloride: 102 mmol/L (ref 96–106)
Creatinine, Ser: 0.78 mg/dL (ref 0.57–1.00)
Globulin, Total: 1.7 g/dL (ref 1.5–4.5)
Glucose: 92 mg/dL (ref 70–99)
Potassium: 4.3 mmol/L (ref 3.5–5.2)
Sodium: 137 mmol/L (ref 134–144)
Total Protein: 6 g/dL (ref 6.0–8.5)
eGFR: 92 mL/min/{1.73_m2} (ref >59)

## 2023-08-20 LAB — LIPID PANEL
Chol/HDL Ratio: 2.6 {ratio} (ref 0.0–4.4)
Cholesterol, Total: 190 mg/dL (ref 100–199)
HDL: 72 mg/dL (ref >39)
LDL Chol Calc (NIH): 98 mg/dL (ref 0–99)
Triglycerides: 113 mg/dL (ref 0–149)
VLDL Cholesterol Cal: 20 mg/dL (ref 5–40)

## 2023-08-20 LAB — HEMOGLOBIN A1C
Est. average glucose Bld gHb Est-mCnc: 103 mg/dL
Hgb A1c MFr Bld: 5.2 % (ref 4.8–5.6)

## 2023-08-20 LAB — TSH: TSH: 1.71 u[IU]/mL (ref 0.450–4.500)

## 2023-08-23 NOTE — Progress Notes (Unsigned)
Complete physical exam   Patient: Krista Mosley   DOB: 1971/06/14   52 y.o. Female  MRN: [id number] Visit Date: 08/26/2023  Today's healthcare provider: Shirlee Latch, MD   No chief complaint on file.  Subjective    Krista Mosley is a 52 y.o. female who presents today for a complete physical exam.  She reports consuming a {diet types:17450} diet. {Exercise:19826} She generally feels {well/fairly well/poorly:18703}. She reports sleeping {well/fairly well/poorly:18703}. She {does/does not:200015} have additional problems to discuss today.    Discussed the use of AI scribe software for clinical note transcription with the patient, who gave verbal consent to proceed.  History of Present Illness            Last depression screening scores    05/25/2022    9:06 AM 02/14/2022    8:18 AM 11/24/2021   11:04 AM  PHQ 2/9 Scores  PHQ - 2 Score 0 0 0  PHQ- 9 Score 0 0 0   Last fall risk screening    05/25/2022    9:06 AM  Fall Risk   Falls in the past year? 0  Number falls in past yr: 0  Injury with Fall? 0  Risk for fall due to : No Fall Risks  Follow up Falls evaluation completed    {VISON DENTAL STD PSA (Optional):27386}  {History (Optional):23778}  Medications: Outpatient Medications Prior to Visit  Medication Sig   Cetirizine HCl 10 MG CAPS Take by mouth.   clobetasol cream (TEMOVATE) 0.05 % Apply topically.   fluticasone (FLONASE) 50 MCG/ACT nasal spray Place into both nostrils daily.   levonorgestrel-ethinyl estradiol (SEASONALE) 0.15-0.03 MG tablet Take 1 tablet by mouth daily.   No facility-administered medications prior to visit.    Review of Systems {Insert previous labs (optional):23779} {See past labs  Heme  Chem  Endocrine  Serology  Results Review (optional):1}  Objective    There were no vitals taken for this visit. {Insert last BP/Wt (optional):23777}{See vitals history (optional):1}  Physical Exam Vitals reviewed.   Constitutional:      General: She is not in acute distress.    Appearance: Normal appearance. She is well-developed. She is not diaphoretic.  HENT:     Head: Normocephalic and atraumatic.     Right Ear: Tympanic membrane, ear canal and external ear normal.     Left Ear: Tympanic membrane, ear canal and external ear normal.     Nose: Nose normal.     Mouth/Throat:     Mouth: Mucous membranes are moist.     Pharynx: Oropharynx is clear. No oropharyngeal exudate.  Eyes:     General: No scleral icterus.    Conjunctiva/sclera: Conjunctivae normal.     Pupils: Pupils are equal, round, and reactive to light.  Neck:     Thyroid: No thyromegaly.  Cardiovascular:     Rate and Rhythm: Normal rate and regular rhythm.     Heart sounds: Normal heart sounds. No murmur heard. Pulmonary:     Effort: Pulmonary effort is normal. No respiratory distress.     Breath sounds: Normal breath sounds. No wheezing or rales.  Abdominal:     General: There is no distension.     Palpations: Abdomen is soft.     Tenderness: There is no abdominal tenderness.  Musculoskeletal:        General: No deformity.     Cervical back: Neck supple.     Right lower leg: No edema.  Left lower leg: No edema.  Lymphadenopathy:     Cervical: No cervical adenopathy.  Skin:    General: Skin is warm and dry.     Findings: No rash.  Neurological:     Mental Status: She is alert and oriented to person, place, and time. Mental status is at baseline.     Gait: Gait normal.  Psychiatric:        Mood and Affect: Mood normal.        Behavior: Behavior normal.        Thought Content: Thought content normal.      No results found for any visits on 08/26/23.  Assessment & Plan    Routine Health Maintenance and Physical Exam  Exercise Activities and Dietary recommendations  Goals   None     Immunization History  Administered Date(s) Administered   Influenza Split 06/07/2009, 05/27/2010, 06/09/2011, 07/15/2012    Influenza Whole 08/06/2005   Influenza,inj,Quad PF,6+ Mos 07/18/2013, 07/15/2014, 07/23/2015, 05/19/2018   Influenza-Unspecified 06/12/2017, 06/19/2019, 06/14/2020, 05/19/2021   Moderna Sars-Covid-2 Vaccination 06/23/2022   PFIZER(Purple Top)SARS-COV-2 Vaccination 11/20/2019, 12/15/2019, 07/14/2020   Pfizer Covid-19 Vaccine Bivalent Booster 70yrs & up 05/19/2021   Td 08/06/2005, 06/16/2011, 01/10/2016   Tdap 06/16/2011, 01/10/2016   Zoster Recombinant(Shingrix) 06/23/2022, 10/06/2022    Health Maintenance  Topic Date Due   INFLUENZA VACCINE  04/11/2023   COVID-19 Vaccine (6 - 2024-25 season) 05/12/2023   MAMMOGRAM  10/08/2024   DTaP/Tdap/Td (6 - Td or Tdap) 01/09/2026   Cervical Cancer Screening (HPV/Pap Cotest)  08/24/2027   Colonoscopy  09/30/2030   Hepatitis C Screening  Completed   HIV Screening  Completed   Zoster Vaccines- Shingrix  Completed   HPV VACCINES  Aged Out    Discussed health benefits of physical activity, and encouraged her to engage in regular exercise appropriate for her age and condition.  Problem List Items Addressed This Visit   None   Assessment and Plan               No follow-ups on file.     Shirlee Latch, MD  [facility] [phone] (phone) [phone] (fax)  [facility]

## 2023-08-26 ENCOUNTER — Encounter: Payer: Self-pay | Admitting: Family Medicine

## 2023-08-26 ENCOUNTER — Ambulatory Visit (INDEPENDENT_AMBULATORY_CARE_PROVIDER_SITE_OTHER): Payer: BC Managed Care – PPO | Admitting: Family Medicine

## 2023-08-26 VITALS — BP 135/81 | HR 80 | Ht 63.0 in | Wt 172.2 lb

## 2023-08-26 DIAGNOSIS — E7800 Pure hypercholesterolemia, unspecified: Secondary | ICD-10-CM | POA: Diagnosis not present

## 2023-08-26 DIAGNOSIS — E663 Overweight: Secondary | ICD-10-CM

## 2023-08-26 DIAGNOSIS — Z862 Personal history of diseases of the blood and blood-forming organs and certain disorders involving the immune mechanism: Secondary | ICD-10-CM

## 2023-08-26 DIAGNOSIS — E038 Other specified hypothyroidism: Secondary | ICD-10-CM | POA: Diagnosis not present

## 2023-08-26 DIAGNOSIS — R739 Hyperglycemia, unspecified: Secondary | ICD-10-CM

## 2023-08-26 DIAGNOSIS — Z0000 Encounter for general adult medical examination without abnormal findings: Secondary | ICD-10-CM

## 2023-08-26 DIAGNOSIS — Z1231 Encounter for screening mammogram for malignant neoplasm of breast: Secondary | ICD-10-CM

## 2023-09-02 DIAGNOSIS — L2089 Other atopic dermatitis: Secondary | ICD-10-CM | POA: Diagnosis not present

## 2023-09-26 ENCOUNTER — Encounter: Payer: Self-pay | Admitting: Family Medicine

## 2023-09-28 ENCOUNTER — Other Ambulatory Visit: Payer: Self-pay | Admitting: Family Medicine

## 2023-09-30 NOTE — Telephone Encounter (Signed)
Requested Prescriptions  Pending Prescriptions Disp Refills   levonorgestrel-ethinyl estradiol (SEASONALE) 0.15-0.03 MG tablet [Pharmacy Med Name: LEVONOR-ETH ESTRAD 0.15-0.03] 91 tablet 1    Sig: TAKE 1 TABLET BY MOUTH EVERY DAY     OB/GYN:  Contraceptives Passed - 09/30/2023  8:49 AM      Passed - Last BP in normal range    BP Readings from Last 1 Encounters:  08/26/23 135/81         Passed - Valid encounter within last 12 months    Recent Outpatient Visits           1 month ago Encounter for annual physical exam   [facility] [place], Marzella Schlein, MD   1 year ago Encounter for annual physical exam   [facility] [place], Marzella Schlein, MD   1 year ago Pruritus   [facility], [place], PA-C   1 year ago Skin rash   [facility], [place], PA-C   1 year ago Skin rash   [facility] [place], [place], PA-C       Future Appointments             In 11 months Bacigalupo, Marzella Schlein, MD [facility], [facility]            Passed - Patient is not a smoker

## 2023-10-10 ENCOUNTER — Ambulatory Visit
Admission: RE | Admit: 2023-10-10 | Discharge: 2023-10-10 | Disposition: A | Discharge status: Home / Self Care | Payer: BC Managed Care – PPO | Source: Ambulatory Visit | Attending: Family Medicine | Admitting: Family Medicine

## 2023-10-10 DIAGNOSIS — Z1231 Encounter for screening mammogram for malignant neoplasm of breast: Secondary | ICD-10-CM | POA: Insufficient documentation

## 2023-10-14 ENCOUNTER — Encounter: Payer: Self-pay | Admitting: Family Medicine

## 2023-11-15 IMAGING — MG DIGITAL DIAGNOSTIC BILAT W/ TOMO W/ CAD
8 series · 9 of 24 positions shown · non-contrast
Comparison: Previous exam(s).

CLINICAL DATA: BI-RADS 3 follow-up of LEFT breast asymmetry,
initiated 5757

EXAM:
DIGITAL DIAGNOSTIC BILATERAL MAMMOGRAM WITH TOMOSYNTHESIS AND CAD
TECHNIQUE: Bilateral digital diagnostic mammography and breast tomosynthesis
was performed. The images were evaluated with computer-aided
detection.

[L CC synth-2D]
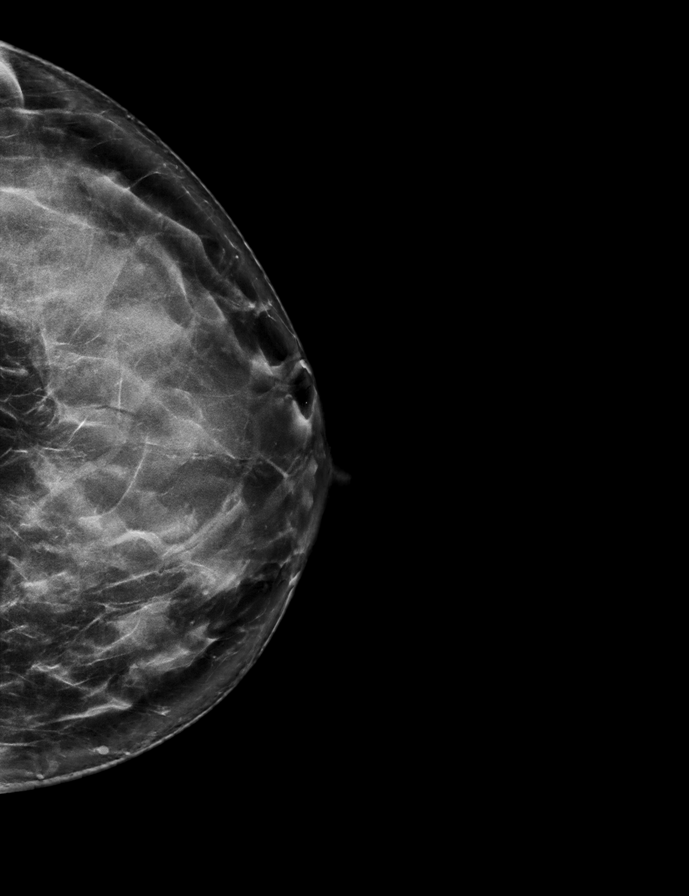

[R CC synth-2D]
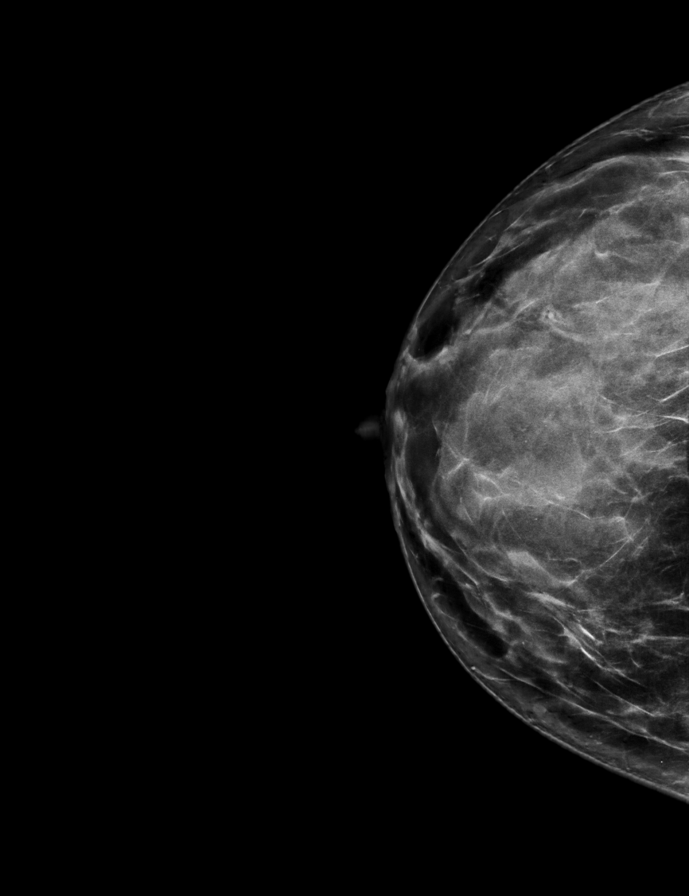

[R MLO synth-2D]
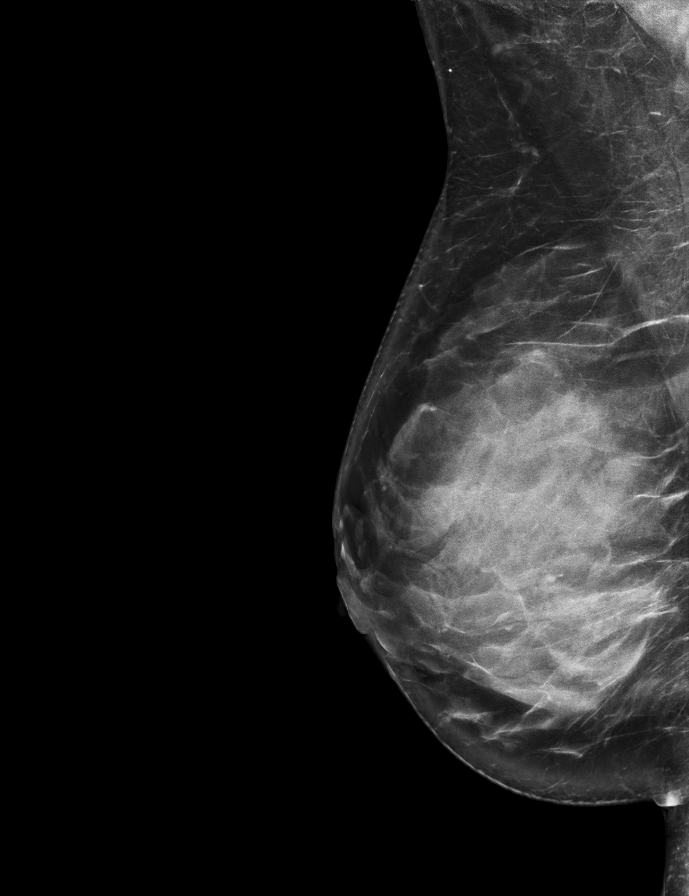

[L MLO synth-2D]
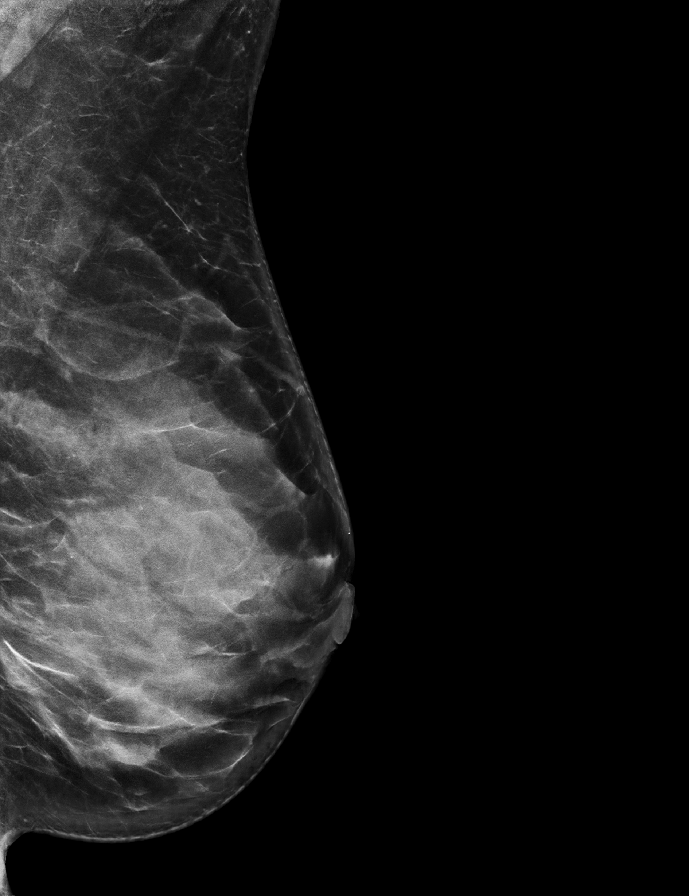

[R MLO tomo · 2 of 82 frames shown]
[frame 27/82]
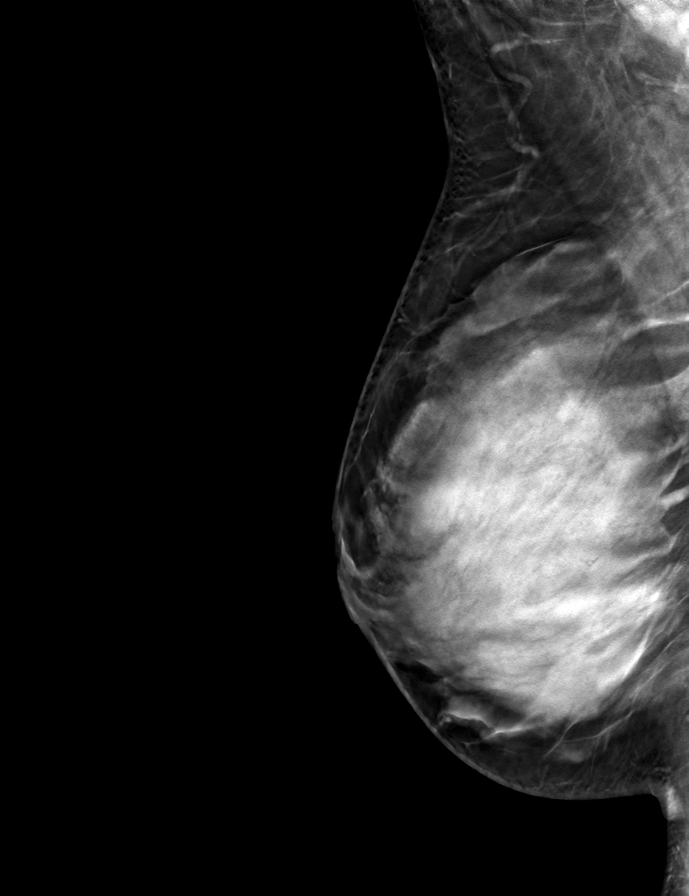
[frame 41/82]
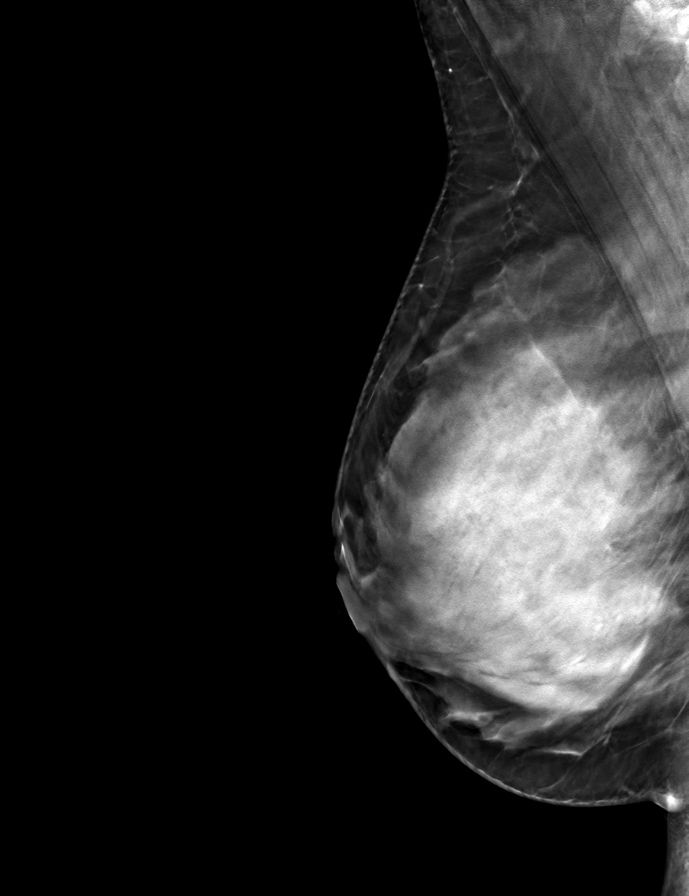

[L CC tomo · tomo slice 37/74.0]
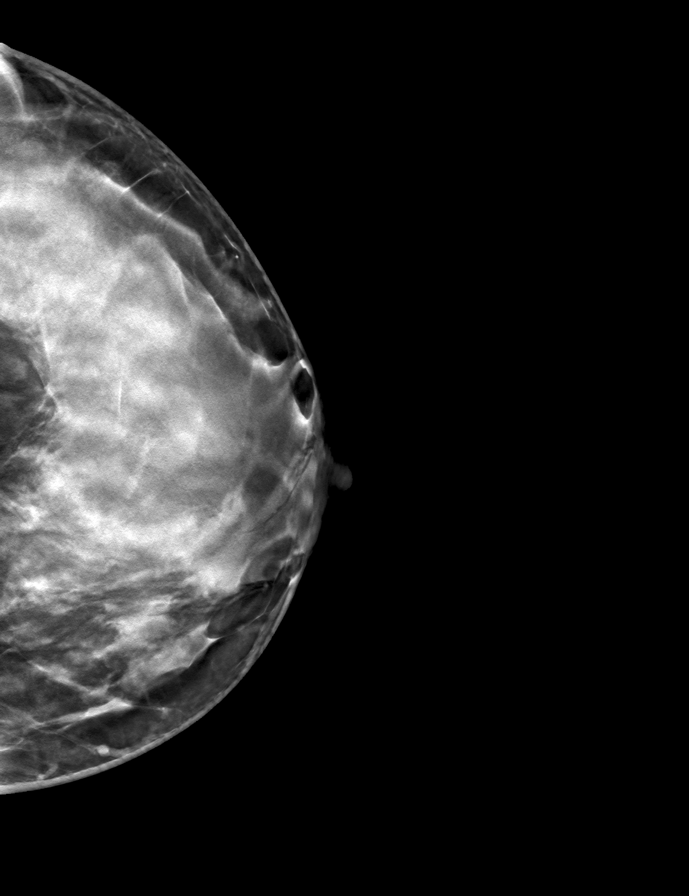

[R CC tomo · tomo slice 35/69.0]
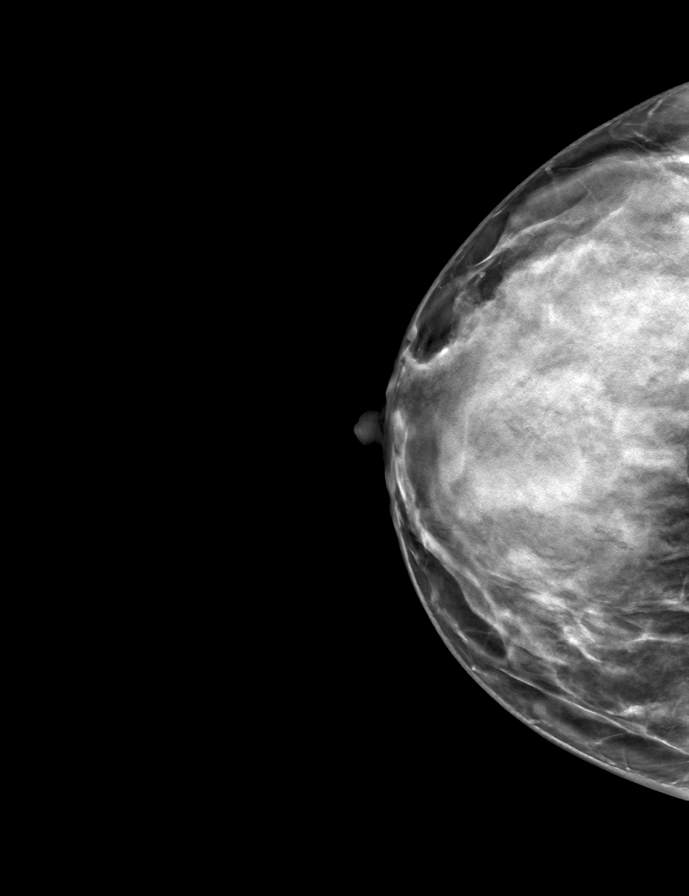

[L MLO tomo · tomo slice 41/80.0]
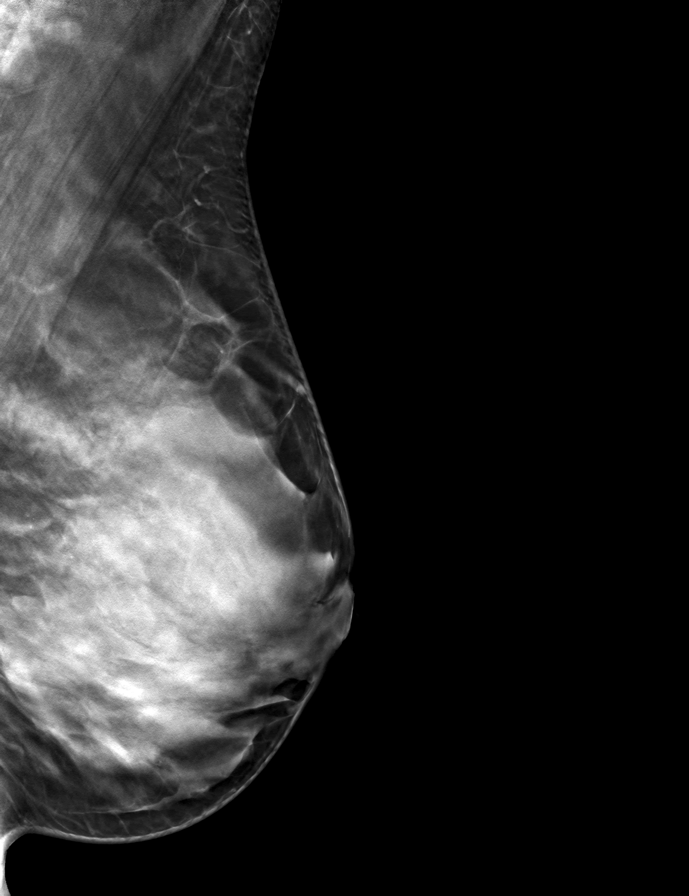

[9 of 24 positions shown; findings below may reference images not displayed]

ACR Breast Density Category d: The breast tissue is extremely dense,
which lowers the sensitivity of mammography.
FINDINGS: Diagnostic images demonstrate mammographic stability of an asymmetry
in the LEFT inner breast at posterior depth seen on CC view only.
This has been stable for greater than 2 years, most consistent with
benign fibroglandular tissue. No new suspicious finding is noted in
the LEFT breast. No suspicious mass, distortion, or
microcalcifications are identified to suggest presence of
malignancy.
IMPRESSION: No mammographic evidence of malignancy bilaterally.

RECOMMENDATION:
Screening mammogram in one year.(Code:IA-E-CXZ)

I have discussed the findings and recommendations with the patient.
If applicable, a reminder letter will be sent to the patient
regarding the next appointment.

BI-RADS CATEGORY  2: Benign.

## 2024-03-17 ENCOUNTER — Other Ambulatory Visit: Payer: Self-pay | Admitting: Family Medicine

## 2024-03-19 ENCOUNTER — Other Ambulatory Visit: Payer: Self-pay | Admitting: Family Medicine

## 2024-03-19 NOTE — Telephone Encounter (Signed)
 Requested Prescriptions  Pending Prescriptions Disp Refills   levonorgestrel-ethinyl estradiol  (SEASONALE) 0.15-0.03 MG tablet [Pharmacy Med Name: LEVONOR-ETH ESTRAD 0.15-0.03] 91 tablet 0    Sig: TAKE 1 TABLET BY MOUTH EVERY DAY     OB/GYN:  Contraceptives Failed - 03/19/2024 11:53 AM      Failed - Valid encounter within last 12 months    Recent Outpatient Visits   None     Future Appointments             In 5 months Bacigalupo, Jon HERO, MD [facility]            Passed - Last BP in normal range    BP Readings from Last 1 Encounters:  08/26/23 135/81         Passed - Patient is not a smoker

## 2024-03-19 NOTE — Telephone Encounter (Signed)
 Copied from CRM [id number]. Topic: Clinical - Medication Refill >> Mar 19, 2024 11:16 AM Ivette P wrote: Medication: levonorgestrel-ethinyl estradiol  (SEASONALE) 0.15-0.03 MG tablet  Has the patient contacted their pharmacy? Yes (Agent: If no, request that the patient contact the pharmacy for the refill. If patient does not wish to contact the pharmacy document the reason why and proceed with request.) (Agent: If yes, when and what did the pharmacy advise?)  This is the patient's preferred pharmacy:  [id number] [place], [place] - [address] [place] [address] Phone: [phone] Fax: [phone]  Is this the correct pharmacy for this prescription? Yes If no, delete pharmacy and type the correct one.   Has the prescription been filled recently? No  Is the patient out of the medication? Yes  Has the patient been seen for an appointment in the last year OR does the patient have an upcoming appointment? Yes  Can we respond through MyChart? Yes  Agent: Please be advised that Rx refills may take up to 3 business days. We ask that you follow-up with your pharmacy.

## 2024-03-20 NOTE — Telephone Encounter (Signed)
 Duplicate request- filled 03/19/24 Requested Prescriptions  Pending Prescriptions Disp Refills   levonorgestrel-ethinyl estradiol  (SEASONALE) 0.15-0.03 MG tablet 91 tablet 1    Sig: Take 1 tablet by mouth daily.     OB/GYN:  Contraceptives Failed - 03/20/2024  3:49 PM      Failed - Valid encounter within last 12 months    Recent Outpatient Visits   None     Future Appointments             In 5 months Bacigalupo, Jon HERO, MD [facility]            Passed - Last BP in normal range    BP Readings from Last 1 Encounters:  08/26/23 135/81         Passed - Patient is not a smoker

## 2024-06-14 ENCOUNTER — Other Ambulatory Visit: Payer: Self-pay | Admitting: Family Medicine

## 2024-06-16 ENCOUNTER — Telehealth: Payer: Self-pay

## 2024-06-16 NOTE — Telephone Encounter (Unsigned)
 Copied from CRM [id number]. Topic: General - Other >> Jun 15, 2024  4:39 PM Joesph B wrote: Reason for CRM: patient is calling in regards to a covid vaccine. She has some questions about  new guidelines  she has seen on the news. [phone]

## 2024-06-17 NOTE — Telephone Encounter (Signed)
 Pt advised. Verbalized understanding.

## 2024-06-17 NOTE — Telephone Encounter (Signed)
 I would recommend it for her. If there are specific questions, happy to address

## 2024-08-14 ENCOUNTER — Encounter: Payer: Self-pay | Admitting: Family Medicine

## 2024-08-14 DIAGNOSIS — E663 Overweight: Secondary | ICD-10-CM

## 2024-08-14 DIAGNOSIS — Z862 Personal history of diseases of the blood and blood-forming organs and certain disorders involving the immune mechanism: Secondary | ICD-10-CM

## 2024-08-14 DIAGNOSIS — E038 Other specified hypothyroidism: Secondary | ICD-10-CM

## 2024-08-14 DIAGNOSIS — R739 Hyperglycemia, unspecified: Secondary | ICD-10-CM

## 2024-08-14 DIAGNOSIS — E7800 Pure hypercholesterolemia, unspecified: Secondary | ICD-10-CM

## 2024-08-21 LAB — COMPREHENSIVE METABOLIC PANEL WITH GFR
ALT: 31 IU/L (ref 0–32)
AST: 18 IU/L (ref 0–40)
Albumin: 4.5 g/dL (ref 3.8–4.9)
Alkaline Phosphatase: 56 IU/L (ref 49–135)
BUN/Creatinine Ratio: 17 (ref 9–23)
BUN: 13 mg/dL (ref 6–24)
Bilirubin Total: 0.7 mg/dL (ref 0.0–1.2)
CO2: 22 mmol/L (ref 20–29)
Calcium: 9.7 mg/dL (ref 8.7–10.2)
Chloride: 101 mmol/L (ref 96–106)
Creatinine, Ser: 0.77 mg/dL (ref 0.57–1.00)
Globulin, Total: 1.9 g/dL (ref 1.5–4.5)
Glucose: 89 mg/dL (ref 70–99)
Potassium: 4.5 mmol/L (ref 3.5–5.2)
Sodium: 138 mmol/L (ref 134–144)
Total Protein: 6.4 g/dL (ref 6.0–8.5)
eGFR: 93 mL/min/1.73 (ref >59)

## 2024-08-21 LAB — CBC
Hematocrit: 42.7 % (ref 34.0–46.6)
Hemoglobin: 14.3 g/dL (ref 11.1–15.9)
MCH: 32 pg (ref 26.6–33.0)
MCHC: 33.5 g/dL (ref 31.5–35.7)
MCV: 96 fL (ref 79–97)
Platelets: 357 x10E3/uL (ref 150–450)
RBC: 4.47 x10E6/uL (ref 3.77–5.28)
RDW: 12.8 % (ref 11.7–15.4)
WBC: 4.9 x10E3/uL (ref 3.4–10.8)

## 2024-08-21 LAB — TSH: TSH: 2.55 u[IU]/mL (ref 0.450–4.500)

## 2024-08-21 LAB — LIPID PANEL
Chol/HDL Ratio: 2.9 ratio (ref 0.0–4.4)
Cholesterol, Total: 207 mg/dL — ABNORMAL HIGH (ref 100–199)
HDL: 71 mg/dL (ref >39)
LDL Chol Calc (NIH): 114 mg/dL — ABNORMAL HIGH (ref 0–99)
Triglycerides: 128 mg/dL (ref 0–149)
VLDL Cholesterol Cal: 22 mg/dL (ref 5–40)

## 2024-08-21 LAB — HEMOGLOBIN A1C
Est. average glucose Bld gHb Est-mCnc: 103 mg/dL
Hgb A1c MFr Bld: 5.2 % (ref 4.8–5.6)

## 2024-08-27 ENCOUNTER — Ambulatory Visit: Payer: Self-pay | Admitting: Family Medicine

## 2024-08-27 VITALS — BP 125/85 | HR 80 | Ht 64.17 in | Wt 175.5 lb

## 2024-08-27 DIAGNOSIS — Z0001 Encounter for general adult medical examination with abnormal findings: Secondary | ICD-10-CM | POA: Diagnosis not present

## 2024-08-27 DIAGNOSIS — E782 Mixed hyperlipidemia: Secondary | ICD-10-CM

## 2024-08-27 DIAGNOSIS — L209 Atopic dermatitis, unspecified: Secondary | ICD-10-CM

## 2024-08-27 DIAGNOSIS — N951 Menopausal and female climacteric states: Secondary | ICD-10-CM | POA: Diagnosis not present

## 2024-08-27 DIAGNOSIS — Z1231 Encounter for screening mammogram for malignant neoplasm of breast: Secondary | ICD-10-CM | POA: Diagnosis not present

## 2024-08-27 DIAGNOSIS — Z23 Encounter for immunization: Secondary | ICD-10-CM | POA: Diagnosis not present

## 2024-08-27 DIAGNOSIS — Z0000 Encounter for general adult medical examination without abnormal findings: Secondary | ICD-10-CM

## 2024-08-27 NOTE — Progress Notes (Signed)
 Complete physical exam   Patient: Krista Mosley   DOB: 1970/10/22   53 y.o. Female  MRN: [id number] Visit Date: 08/27/2024  Today's healthcare provider: Jon Eva, MD   Chief Complaint  Patient presents with   Annual Exam    Last completed 08/26/23 Diet -  balanced Exercise - walking daily for forty-five minutes to an hour Feeling - well Sleeping - well Concerns -  none   Subjective    Krista Mosley is a 53 y.o. female who presents today for a complete physical exam.    Discussed the use of AI scribe software for clinical note transcription with the patient, who gave verbal consent to proceed.  History of Present Illness   Krista Mosley is a 53 year old female who presents for an annual physical exam and discussion of hormone therapy.  She is concerned that her current hormone regimen, including birth control pills, testosterone  therapy, and 15 mg daily thyroid  medication, is not effective. She has not noticed symptom improvement despite high testosterone  levels and stable thyroid  hormone levels since starting thyroid  medication. She is monitoring anti-TPO antibodies, which were initially high and recently 303.  Her menses occur every three months on birth control and are not heavy. She continues to have perimenopausal symptoms and is unsure if she has reached menopause.  She uses Dupixent for eczema with good control.  Recent labs showed LDL increase from 98 to 114 and total cholesterol from 190 to 207, with HDL remaining good. She attributes this partly to relaxing her previously low-carb diet over the past year.        Last depression screening scores    08/27/2024    8:33 AM 08/26/2023    8:16 AM 05/25/2022    9:06 AM  PHQ 2/9 Scores  PHQ - 2 Score 0 0 0  PHQ- 9 Score   0      Data saved with a previous flowsheet row definition   Last fall risk screening    08/27/2024    8:33 AM  Fall Risk   Falls in the past year? 0  Number falls in past  yr: 0  Injury with Fall? 0  Risk for fall due to : No Fall Risks  Follow up Falls evaluation completed        Medications: Show/hide medication list[1]  Review of Systems    Objective    BP 125/85 (BP Location: Left Arm, Patient Position: Sitting, Cuff Size: Normal)   Pulse 80   Ht 5' 4.17 (1.63 m)   Wt 175 lb 8 oz (79.6 kg)   SpO2 100%   BMI 29.96 kg/m    Physical Exam Vitals reviewed.  Constitutional:      General: She is not in acute distress.    Appearance: Normal appearance. She is well-developed. She is not diaphoretic.  HENT:     Head: Normocephalic and atraumatic.     Right Ear: Tympanic membrane, ear canal and external ear normal.     Left Ear: Tympanic membrane, ear canal and external ear normal.     Nose: Nose normal.     Mouth/Throat:     Mouth: Mucous membranes are moist.     Pharynx: Oropharynx is clear. No oropharyngeal exudate.  Eyes:     General: No scleral icterus.    Conjunctiva/sclera: Conjunctivae normal.     Pupils: Pupils are equal, round, and reactive to light.  Neck:     Thyroid : No thyromegaly.  Cardiovascular:     Rate and Rhythm: Normal rate and regular rhythm.     Heart sounds: Normal heart sounds. No murmur heard. Pulmonary:     Effort: Pulmonary effort is normal. No respiratory distress.     Breath sounds: Normal breath sounds. No wheezing or rales.  Abdominal:     General: There is no distension.     Palpations: Abdomen is soft.     Tenderness: There is no abdominal tenderness.  Musculoskeletal:        General: No deformity.     Cervical back: Neck supple.     Right lower leg: No edema.     Left lower leg: No edema.  Lymphadenopathy:     Cervical: No cervical adenopathy.  Skin:    General: Skin is warm and dry.     Findings: No rash.  Neurological:     Mental Status: She is alert and oriented to person, place, and time. Mental status is at baseline.     Gait: Gait normal.  Psychiatric:        Mood and Affect: Mood  normal.        Behavior: Behavior normal.        Thought Content: Thought content normal.      The 10-year ASCVD risk score (Arnett DK, et al., 2019) is: 1.1%   No results found for any visits on 08/27/24.  Assessment & Plan    Routine Health Maintenance and Physical Exam  Exercise Activities and Dietary recommendations  Goals   None     Immunization History  Administered Date(s) Administered   Influenza Split 06/07/2009, 05/27/2010, 06/09/2011, 07/15/2012   Influenza Whole 08/06/2005   Influenza, Seasonal, Injecte, Preservative Fre 06/15/2024   Influenza,inj,Quad PF,6+ Mos 07/18/2013, 07/15/2014, 07/23/2015, 05/19/2018   Influenza-Unspecified 06/12/2017, 06/19/2019, 06/14/2020, 05/19/2021, 06/12/2023   Moderna Sars-Covid-2 Vaccination 06/23/2022   PFIZER(Purple Top)SARS-COV-2 Vaccination 11/20/2019, 12/15/2019, 07/14/2020, 06/15/2023   PNEUMOCOCCAL CONJUGATE-20 08/27/2024   Pfizer Covid-19 Vaccine Bivalent Booster 61yrs & up 05/19/2021   Pfizer(Comirnaty)Fall Seasonal Vaccine 12 years and older 06/15/2024   Td 08/06/2005, 06/16/2011, 01/10/2016   Tdap 06/16/2011, 01/10/2016   Zoster Recombinant(Shingrix) 06/23/2022, 10/06/2022    Health Maintenance  Topic Date Due   Hepatitis B Vaccines 19-59 Average Risk (1 of 3 - 19+ 3-dose series) Never done   COVID-19 Vaccine (8 - Pfizer risk 2025-26 season) 12/14/2024   Mammogram  10/09/2025   DTaP/Tdap/Td (6 - Td or Tdap) 01/09/2026   Cervical Cancer Screening (HPV/Pap Cotest)  08/24/2027   Colonoscopy  09/30/2030   Pneumococcal Vaccine: 50+ Years  Completed   Influenza Vaccine  Completed   Hepatitis C Screening  Completed   HIV Screening  Completed   Zoster Vaccines- Shingrix  Completed   HPV VACCINES  Aged Out   Meningococcal B Vaccine  Aged Out    Discussed health benefits of physical activity, and encouraged her to engage in regular exercise appropriate for her age and condition.  Problem List Items Addressed This  Visit   None Visit Diagnoses       Encounter for annual physical exam    -  Primary     Immunization due       Relevant Orders   Pneumococcal conjugate vaccine 20-valent (Prevnar 20) (Completed)     Breast cancer screening by mammogram       Relevant Orders   MM 3D SCREENING MAMMOGRAM BILATERAL BREAST     Perimenopausal         Atopic dermatitis,  unspecified type         Moderate mixed hyperlipidemia not requiring statin therapy              Woman's Wellness Visit Routine wellness visit with no acute concerns. Pneumonia vaccine administered. COVID booster received this year. Tetanus up to date. Mammogram and bone density screening due. Hepatitis B vaccination status to be checked with titer at next labs. - Ordered mammogram - Will check hepatitis B titer at next labs  Perimenopausal state Experiencing perimenopausal symptoms with irregular periods every three months. Birth control pill effectively managing symptoms. No significant mood changes reported. Discussed hormone replacement therapy options if needed. - Continue birth control pill - Monitor menstrual cycle for signs of menopause  Atopic dermatitis Managed with Dupixent, which has been effective. Dermatologist follow-up scheduled for January for prescription renewal and annual visit. - Continue Dupixent - Follow up with dermatologist in January  Hyperlipidemia LDL cholesterol increased from 98 to 114, total cholesterol from 190 to 207. HDL remains in a good range. Overall cardiovascular risk remains low at 1.1%. - Encouraged dietary and exercise modifications to manage cholesterol levels       Return in about 1 year (around 08/27/2025) for CPE.     Jon Eva, MD  [facility] [phone] (phone) [phone] (fax)  [facility]      [1]  Outpatient Medications Prior to Visit  Medication Sig   calcium carbonate (OS-CAL) 1250 (500 Ca) MG chewable tablet Chew 1  tablet by mouth daily.   Cetirizine HCl 10 MG CAPS Take by mouth.   clobetasol cream (TEMOVATE) 0.05 % Apply topically.   cyanocobalamin (VITAMIN B12) 1000 MCG tablet Take 1,000 mcg by mouth daily.   Dupilumab (DUPIXENT [place]) Inject into the skin.   ferrous sulfate 324 MG TBEC Take 324 mg by mouth.   fluticasone (FLONASE) 50 MCG/ACT nasal spray Place into both nostrils daily.   levonorgestrel-ethinyl estradiol  (SEASONALE) 0.15-0.03 MG tablet TAKE 1 TABLET BY MOUTH EVERY DAY   NP THYROID  15 MG tablet Take 15 mg by mouth daily.   TESTOSTERONE  BU    No facility-administered medications prior to visit.

## 2024-10-12 ENCOUNTER — Ambulatory Visit
Admission: RE | Admit: 2024-10-12 | Discharge: 2024-10-12 | Disposition: A | Source: Ambulatory Visit | Attending: Family Medicine

## 2024-10-12 ENCOUNTER — Encounter

## 2024-10-12 DIAGNOSIS — Z1231 Encounter for screening mammogram for malignant neoplasm of breast: Secondary | ICD-10-CM

## 2024-10-15 ENCOUNTER — Ambulatory Visit: Payer: Self-pay | Admitting: Family Medicine

## 2025-08-30 ENCOUNTER — Encounter: Admitting: Family Medicine
